# Patient Record
Sex: Female | Born: 1950 | Race: Black or African American | Hispanic: No | Marital: Married | Smoking: Never smoker
Health system: Southern US, Community
[De-identification: ages and names within clinical notes are randomized; demographics above are authoritative.]

## PROBLEM LIST (undated history)

## (undated) DIAGNOSIS — H409 Unspecified glaucoma: Secondary | ICD-10-CM

## (undated) DIAGNOSIS — E119 Type 2 diabetes mellitus without complications: Secondary | ICD-10-CM

## (undated) DIAGNOSIS — D649 Anemia, unspecified: Secondary | ICD-10-CM

## (undated) DIAGNOSIS — N189 Chronic kidney disease, unspecified: Secondary | ICD-10-CM

## (undated) DIAGNOSIS — I10 Essential (primary) hypertension: Secondary | ICD-10-CM

## (undated) HISTORY — PX: BILATERAL SALPINGECTOMY: SHX5743

## (undated) HISTORY — PX: HERNIA REPAIR: SHX51

---

## 1998-11-11 ENCOUNTER — Other Ambulatory Visit: Admission: RE | Admit: 1998-11-11 | Discharge: 1998-11-11 | Payer: Self-pay | Admitting: Internal Medicine

## 2000-11-30 ENCOUNTER — Emergency Department (HOSPITAL_COMMUNITY): Admission: EM | Admit: 2000-11-30 | Discharge: 2000-11-30 | Payer: Self-pay | Admitting: Emergency Medicine

## 2000-11-30 ENCOUNTER — Encounter: Payer: Self-pay | Admitting: Emergency Medicine

## 2001-04-14 ENCOUNTER — Encounter: Admission: RE | Admit: 2001-04-14 | Discharge: 2001-04-14 | Payer: Self-pay | Admitting: Internal Medicine

## 2001-04-14 ENCOUNTER — Encounter: Payer: Self-pay | Admitting: Internal Medicine

## 2004-06-19 ENCOUNTER — Encounter: Admission: RE | Admit: 2004-06-19 | Discharge: 2004-06-19 | Payer: Self-pay | Admitting: Family Medicine

## 2004-08-18 ENCOUNTER — Other Ambulatory Visit: Admission: RE | Admit: 2004-08-18 | Discharge: 2004-08-18 | Payer: Self-pay | Admitting: Internal Medicine

## 2008-01-01 ENCOUNTER — Other Ambulatory Visit: Admission: RE | Admit: 2008-01-01 | Discharge: 2008-01-01 | Payer: Self-pay | Admitting: Family Medicine

## 2008-07-08 ENCOUNTER — Emergency Department (HOSPITAL_COMMUNITY): Admission: EM | Admit: 2008-07-08 | Discharge: 2008-07-08 | Payer: Self-pay | Admitting: Emergency Medicine

## 2009-06-12 ENCOUNTER — Emergency Department (HOSPITAL_COMMUNITY): Admission: EM | Admit: 2009-06-12 | Discharge: 2009-06-12 | Payer: Self-pay | Admitting: Emergency Medicine

## 2010-07-19 LAB — URINALYSIS, ROUTINE W REFLEX MICROSCOPIC
Bilirubin Urine: NEGATIVE
Glucose, UA: NEGATIVE mg/dL
Hgb urine dipstick: NEGATIVE
Ketones, ur: 15 mg/dL — AB
Specific Gravity, Urine: 1.024 (ref 1.005–1.030)
pH: 5.5 (ref 5.0–8.0)

## 2010-07-19 LAB — DIFFERENTIAL
Eosinophils Absolute: 0.1 10*3/uL (ref 0.0–0.7)
Eosinophils Relative: 1 % (ref 0–5)
Lymphocytes Relative: 24 % (ref 12–46)
Lymphs Abs: 1.7 10*3/uL (ref 0.7–4.0)
Monocytes Absolute: 0.4 10*3/uL (ref 0.1–1.0)
Monocytes Relative: 5 % (ref 3–12)

## 2010-07-19 LAB — POCT CARDIAC MARKERS
CKMB, poc: <1 ng/mL — ABNORMAL LOW (ref 1.0–8.0)
Myoglobin, poc: 132 ng/mL (ref 12–200)

## 2010-07-19 LAB — CBC
MCHC: 33.3 g/dL (ref 30.0–36.0)
Platelets: 245 10*3/uL (ref 150–400)
RBC: 4.24 MIL/uL (ref 3.87–5.11)

## 2010-07-19 LAB — COMPREHENSIVE METABOLIC PANEL
ALT: 28 U/L (ref 0–35)
AST: 30 U/L (ref 0–37)
Albumin: 3.9 g/dL (ref 3.5–5.2)
CO2: 26 mEq/L (ref 19–32)
Calcium: 10.1 mg/dL (ref 8.4–10.5)
GFR calc Af Amer: 43 mL/min — ABNORMAL LOW (ref >60)
GFR calc non Af Amer: 35 mL/min — ABNORMAL LOW (ref >60)
Sodium: 140 mEq/L (ref 135–145)
Total Protein: 7.2 g/dL (ref 6.0–8.3)

## 2010-07-19 LAB — URINE MICROSCOPIC-ADD ON

## 2010-08-10 LAB — POCT CARDIAC MARKERS
CKMB, poc: 1.7 ng/mL (ref 1.0–8.0)
CKMB, poc: <1 ng/mL — ABNORMAL LOW (ref 1.0–8.0)
Troponin i, poc: <0.05 ng/mL (ref 0.00–0.09)
Troponin i, poc: <0.05 ng/mL (ref 0.00–0.09)

## 2010-08-10 LAB — CBC
HCT: 38.8 % (ref 36.0–46.0)
Hemoglobin: 13 g/dL (ref 12.0–15.0)
Platelets: 290 10*3/uL (ref 150–400)
RDW: 14.3 % (ref 11.5–15.5)
WBC: 7 10*3/uL (ref 4.0–10.5)

## 2010-08-10 LAB — BASIC METABOLIC PANEL
BUN: 15 mg/dL (ref 6–23)
Calcium: 9.9 mg/dL (ref 8.4–10.5)
GFR calc non Af Amer: 48 mL/min — ABNORMAL LOW (ref >60)
Glucose, Bld: 87 mg/dL (ref 70–99)
Potassium: 3.8 mEq/L (ref 3.5–5.1)
Sodium: 142 mEq/L (ref 135–145)

## 2010-08-10 LAB — DIFFERENTIAL
Basophils Absolute: 0 10*3/uL (ref 0.0–0.1)
Eosinophils Relative: 2 % (ref 0–5)
Lymphocytes Relative: 33 % (ref 12–46)
Lymphs Abs: 2.3 10*3/uL (ref 0.7–4.0)
Neutro Abs: 4.2 10*3/uL (ref 1.7–7.7)

## 2013-12-02 ENCOUNTER — Inpatient Hospital Stay (HOSPITAL_COMMUNITY)
Admission: EM | Admit: 2013-12-02 | Discharge: 2013-12-05 | DRG: 378 | Disposition: A | Discharge status: Home Health | Payer: Medicare HMO | Attending: Internal Medicine | Admitting: Internal Medicine

## 2013-12-02 ENCOUNTER — Emergency Department (HOSPITAL_COMMUNITY): Payer: Medicare HMO

## 2013-12-02 ENCOUNTER — Encounter (HOSPITAL_COMMUNITY): Payer: Self-pay | Admitting: Emergency Medicine

## 2013-12-02 DIAGNOSIS — N179 Acute kidney failure, unspecified: Secondary | ICD-10-CM

## 2013-12-02 DIAGNOSIS — K921 Melena: Principal | ICD-10-CM | POA: Diagnosis present

## 2013-12-02 DIAGNOSIS — H409 Unspecified glaucoma: Secondary | ICD-10-CM

## 2013-12-02 DIAGNOSIS — J338 Other polyp of sinus: Secondary | ICD-10-CM | POA: Diagnosis present

## 2013-12-02 DIAGNOSIS — N183 Chronic kidney disease, stage 3 unspecified: Secondary | ICD-10-CM | POA: Diagnosis present

## 2013-12-02 DIAGNOSIS — K449 Diaphragmatic hernia without obstruction or gangrene: Secondary | ICD-10-CM | POA: Diagnosis present

## 2013-12-02 DIAGNOSIS — D500 Iron deficiency anemia secondary to blood loss (chronic): Secondary | ICD-10-CM | POA: Diagnosis present

## 2013-12-02 DIAGNOSIS — K648 Other hemorrhoids: Secondary | ICD-10-CM | POA: Diagnosis present

## 2013-12-02 DIAGNOSIS — K5730 Diverticulosis of large intestine without perforation or abscess without bleeding: Secondary | ICD-10-CM | POA: Diagnosis present

## 2013-12-02 DIAGNOSIS — N189 Chronic kidney disease, unspecified: Secondary | ICD-10-CM

## 2013-12-02 DIAGNOSIS — D62 Acute posthemorrhagic anemia: Secondary | ICD-10-CM

## 2013-12-02 DIAGNOSIS — E669 Obesity, unspecified: Secondary | ICD-10-CM | POA: Diagnosis present

## 2013-12-02 DIAGNOSIS — I10 Essential (primary) hypertension: Secondary | ICD-10-CM

## 2013-12-02 DIAGNOSIS — I129 Hypertensive chronic kidney disease with stage 1 through stage 4 chronic kidney disease, or unspecified chronic kidney disease: Secondary | ICD-10-CM | POA: Diagnosis present

## 2013-12-02 DIAGNOSIS — K922 Gastrointestinal hemorrhage, unspecified: Secondary | ICD-10-CM

## 2013-12-02 HISTORY — DX: Unspecified glaucoma: H40.9

## 2013-12-02 HISTORY — DX: Essential (primary) hypertension: I10

## 2013-12-02 HISTORY — DX: Anemia, unspecified: D64.9

## 2013-12-02 LAB — CBC WITH DIFFERENTIAL/PLATELET
BASOS ABS: 0 10*3/uL (ref 0.0–0.1)
Basophils Relative: 0 % (ref 0–1)
Eosinophils Absolute: 0.2 10*3/uL (ref 0.0–0.7)
Eosinophils Relative: 3 % (ref 0–5)
HCT: 26.5 % — ABNORMAL LOW (ref 36.0–46.0)
Hemoglobin: 7.8 g/dL — ABNORMAL LOW (ref 12.0–15.0)
LYMPHS ABS: 1.5 10*3/uL (ref 0.7–4.0)
Lymphocytes Relative: 25 % (ref 12–46)
MCH: 19.8 pg — AB (ref 26.0–34.0)
MCHC: 29.4 g/dL — AB (ref 30.0–36.0)
MCV: 67.3 fL — AB (ref 78.0–100.0)
MONO ABS: 0.4 10*3/uL (ref 0.1–1.0)
Monocytes Relative: 7 % (ref 3–12)
Neutro Abs: 3.8 10*3/uL (ref 1.7–7.7)
Neutrophils Relative %: 65 % (ref 43–77)
PLATELETS: 324 10*3/uL (ref 150–400)
RBC: 3.94 MIL/uL (ref 3.87–5.11)
RDW: 19.4 % — AB (ref 11.5–15.5)
WBC: 5.9 10*3/uL (ref 4.0–10.5)

## 2013-12-02 LAB — CBC
HCT: 31 % — ABNORMAL LOW (ref 36.0–46.0)
HEMOGLOBIN: 9.5 g/dL — AB (ref 12.0–15.0)
MCH: 21.2 pg — ABNORMAL LOW (ref 26.0–34.0)
MCHC: 30.6 g/dL (ref 30.0–36.0)
MCV: 69.2 fL — ABNORMAL LOW (ref 78.0–100.0)
Platelets: 330 10*3/uL (ref 150–400)
RBC: 4.48 MIL/uL (ref 3.87–5.11)
RDW: 19.2 % — AB (ref 11.5–15.5)
WBC: 9.3 10*3/uL (ref 4.0–10.5)

## 2013-12-02 LAB — COMPREHENSIVE METABOLIC PANEL
ALBUMIN: 4 g/dL (ref 3.5–5.2)
ALT: 12 U/L (ref 0–35)
ANION GAP: 15 (ref 5–15)
AST: 19 U/L (ref 0–37)
Alkaline Phosphatase: 105 U/L (ref 39–117)
BUN: 33 mg/dL — AB (ref 6–23)
CALCIUM: 9.9 mg/dL (ref 8.4–10.5)
CO2: 22 mEq/L (ref 19–32)
CREATININE: 1.97 mg/dL — AB (ref 0.50–1.10)
Chloride: 104 mEq/L (ref 96–112)
GFR calc Af Amer: 30 mL/min — ABNORMAL LOW (ref >90)
GFR, EST NON AFRICAN AMERICAN: 25 mL/min — AB (ref >90)
Glucose, Bld: 120 mg/dL — ABNORMAL HIGH (ref 70–99)
Potassium: 4.2 mEq/L (ref 3.7–5.3)
Sodium: 141 mEq/L (ref 137–147)
Total Bilirubin: 0.2 mg/dL — ABNORMAL LOW (ref 0.3–1.2)
Total Protein: 7.5 g/dL (ref 6.0–8.3)

## 2013-12-02 LAB — PREPARE RBC (CROSSMATCH)

## 2013-12-02 LAB — LIPASE, BLOOD: LIPASE: 69 U/L — AB (ref 11–59)

## 2013-12-02 LAB — ABO/RH: ABO/RH(D): O POS

## 2013-12-02 MED ORDER — ALBUTEROL SULFATE (2.5 MG/3ML) 0.083% IN NEBU: 2.5000 mg | INHALATION_SOLUTION | RESPIRATORY_TRACT | Status: DC | PRN

## 2013-12-02 MED ORDER — ONDANSETRON HCL 4 MG PO TABS: 4.0000 mg | ORAL_TABLET | Freq: Four times a day (QID) | ORAL | Status: DC | PRN

## 2013-12-02 MED ORDER — DIPHENHYDRAMINE HCL 50 MG/ML IJ SOLN
25.0000 mg | Freq: Once | INTRAMUSCULAR | Status: DC
  Filled 2013-12-02: qty 1

## 2013-12-02 MED ORDER — PNEUMOCOCCAL VAC POLYVALENT 25 MCG/0.5ML IJ INJ
0.5000 mL | INJECTION | INTRAMUSCULAR | Status: AC
  Administered 2013-12-03: 0.5 mL via INTRAMUSCULAR
  Filled 2013-12-02: qty 0.5

## 2013-12-02 MED ORDER — SODIUM CHLORIDE 0.9 % IV SOLN
INTRAVENOUS | Status: DC
  Administered 2013-12-02: 22:00:00 via INTRAVENOUS

## 2013-12-02 MED ORDER — SODIUM CHLORIDE 0.9 % IV SOLN
Freq: Once | INTRAVENOUS | Status: AC
  Administered 2013-12-02: 18:00:00 via INTRAVENOUS

## 2013-12-02 MED ORDER — GUAIFENESIN-DM 100-10 MG/5ML PO SYRP
5.0000 mL | ORAL_SOLUTION | ORAL | Status: DC | PRN
  Filled 2013-12-02: qty 5

## 2013-12-02 MED ORDER — TRAVOPROST 0.004 % OP SOLN
1.0000 [drp] | Freq: Every day | OPHTHALMIC | Status: DC
  Administered 2013-12-02 – 2013-12-04 (×3): 1 [drp] via OPHTHALMIC
  Filled 2013-12-02: qty 5

## 2013-12-02 MED ORDER — ACETAMINOPHEN 325 MG PO TABS: 650.0000 mg | ORAL_TABLET | Freq: Four times a day (QID) | ORAL | Status: DC | PRN

## 2013-12-02 MED ORDER — BRIMONIDINE TARTRATE 0.2 % OP SOLN
1.0000 [drp] | Freq: Two times a day (BID) | OPHTHALMIC | Status: DC
  Administered 2013-12-02 – 2013-12-04 (×5): 1 [drp] via OPHTHALMIC
  Filled 2013-12-02: qty 5

## 2013-12-02 MED ORDER — PANTOPRAZOLE SODIUM 40 MG IV SOLR
40.0000 mg | Freq: Two times a day (BID) | INTRAVENOUS | Status: DC
  Administered 2013-12-02 – 2013-12-05 (×6): 40 mg via INTRAVENOUS
  Filled 2013-12-02 (×7): qty 40

## 2013-12-02 MED ORDER — ONDANSETRON HCL 4 MG/2ML IJ SOLN: 4.0000 mg | Freq: Once | INTRAMUSCULAR | Status: DC

## 2013-12-02 MED ORDER — ACETAMINOPHEN 325 MG PO TABS
650.0000 mg | ORAL_TABLET | Freq: Once | ORAL | Status: AC
  Administered 2013-12-02: 650 mg via ORAL
  Filled 2013-12-02: qty 2

## 2013-12-02 MED ORDER — BRIMONIDINE TARTRATE-TIMOLOL 0.2-0.5 % OP SOLN: 1.0000 [drp] | Freq: Two times a day (BID) | OPHTHALMIC | Status: DC

## 2013-12-02 MED ORDER — SODIUM CHLORIDE 0.9 % IV SOLN
INTRAVENOUS | Status: DC
  Administered 2013-12-02: 15:00:00 via INTRAVENOUS

## 2013-12-02 MED ORDER — SODIUM CHLORIDE 0.9 % IV SOLN
INTRAVENOUS | Status: DC
Start: 2013-12-03 — End: 2013-12-02
  Administered 2013-12-02: 09:00:00 via INTRAVENOUS

## 2013-12-02 MED ORDER — FUROSEMIDE 10 MG/ML IJ SOLN
20.0000 mg | Freq: Once | INTRAMUSCULAR | Status: AC
  Administered 2013-12-02: 20 mg via INTRAVENOUS
  Filled 2013-12-02: qty 2

## 2013-12-02 MED ORDER — TIMOLOL MALEATE 0.5 % OP SOLN
1.0000 [drp] | Freq: Two times a day (BID) | OPHTHALMIC | Status: DC
  Administered 2013-12-02 – 2013-12-04 (×5): 1 [drp] via OPHTHALMIC
  Filled 2013-12-02: qty 5

## 2013-12-02 MED ORDER — ACETAMINOPHEN 650 MG RE SUPP: 650.0000 mg | Freq: Four times a day (QID) | RECTAL | Status: DC | PRN

## 2013-12-02 MED ORDER — SODIUM CHLORIDE 0.9 % IV BOLUS (SEPSIS)
250.0000 mL | Freq: Once | INTRAVENOUS | Status: AC
  Administered 2013-12-02: 250 mL via INTRAVENOUS

## 2013-12-02 MED ORDER — SODIUM CHLORIDE 0.9 % IV SOLN: 10.0000 mL/h | Freq: Once | INTRAVENOUS | Status: DC

## 2013-12-02 MED ORDER — IOHEXOL 300 MG/ML  SOLN
100.0000 mL | Freq: Once | INTRAMUSCULAR | Status: AC | PRN
  Administered 2013-12-02: 80 mL via INTRAVENOUS

## 2013-12-02 MED ORDER — ONDANSETRON HCL 4 MG/2ML IJ SOLN: 4.0000 mg | Freq: Four times a day (QID) | INTRAMUSCULAR | Status: DC | PRN

## 2013-12-02 MED ORDER — IOHEXOL 300 MG/ML  SOLN
25.0000 mL | INTRAMUSCULAR | Status: DC
  Administered 2013-12-02: 25 mL via ORAL

## 2013-12-02 NOTE — ED Notes (Signed)
Pt states she is here for bright red rectal bleeding. Noticed the commode had a bunch of blood in it this morning after having a BM this am. States she has issues with her Hgb as well and had it checked at PCP on 11/30/13 which was 13. Pt denies any pain associated with the complaint. No N/V.

## 2013-12-02 NOTE — Progress Notes (Signed)
Report called to ED  

## 2013-12-02 NOTE — Progress Notes (Signed)
[facility] [id number] Code Status: Full   Admission Data: 12/02/2013 4:36 PM Attending Provider:  Sloan Leiter  JJO:ACZYSA,YTKZS W, MD Consults/ Treatment Team: Treatment Team:  Missy Sabins, MD  DELORICE BANNISTER is a 63 y.o. female patient admitted from ED awake, alert - oriented  X 3 - no acute distress noted.  VSS - Blood pressure 142/81, pulse 59, temperature 98.3 F (36.8 C), temperature source Oral, resp. rate 15, height 5\' 4"  (1.626 m), weight 91.627 kg (202 lb), SpO2 99.00%.    IV in place, occlusive dsg intact without redness.  Orientation to room, and floor completed with information packet given to patient/family. Admission INP armband ID verified with patient/family, and in place.   SR up x 2, fall assessment complete, with patient and family able to verbalize understanding of risk associated with falls, and verbalized understanding to call nsg before up out of bed.  Call light within reach, patient able to voice, and demonstrate understanding.  Skin, clean-dry- intact without evidence of bruising, or skin tears.   No evidence of skin break down noted on exam.     Will cont to eval and treat per MD orders.  Henriette Combs, [place] 12/02/2013 4:36 PM '

## 2013-12-02 NOTE — ED Notes (Signed)
Consent signed for blood transfusion

## 2013-12-02 NOTE — ED Provider Notes (Signed)
CSN: [id number]     Arrival date & time 12/02/13  0728 History   First MD Initiated Contact with Patient 12/02/13 [phone]     Chief Complaint  Patient presents with  . Rectal Bleeding     (Consider location/radiation/quality/duration/timing/severity/associated sxs/prior Treatment) Patient is a 63 y.o. female presenting with hematochezia. The history is provided by the patient.  Rectal Bleeding Associated symptoms: no abdominal pain and no vomiting    patient with onset of a small amount of rectal bleeding yesterday. This morning had a large bowel movement of red blood. Patient states that she feels a little lightheaded perhaps some mild near syncope feelings. No bowel pain no nausea no vomiting. Patient claims that she had her hemoglobin checked by her primary care Dr. on August 3 and it was 13.  Past Medical History  Diagnosis Date  . Hypertension   . Low hemoglobin    Past Surgical History  Procedure Laterality Date  . Hernia repair    . Bilateral salpingectomy     No family history on file. History  Substance Use Topics  . Smoking status: Never Smoker   . Smokeless tobacco: Not on file  . Alcohol Use: No   OB History   Grav Para Term Preterm Abortions TAB SAB Ect Mult Living                 Review of Systems  Constitutional: Negative for fatigue.  HENT: Negative for congestion.   Eyes: Negative for redness.  Respiratory: Negative for shortness of breath.   Cardiovascular: Negative for chest pain.  Gastrointestinal: Positive for blood in stool and hematochezia. Negative for nausea, vomiting and abdominal pain.  Genitourinary: Negative for dysuria and hematuria.  Musculoskeletal: Negative for back pain.  Skin: Negative for rash.  Hematological: Does not bruise/bleed easily.  Psychiatric/Behavioral: Negative for confusion.      Allergies  Review of patient's allergies indicates no known allergies.  Home Medications   Prior to Admission medications   Medication  Sig Start Date End Date Taking? Authorizing Provider  amLODipine (NORVASC) 10 MG tablet Take 10 mg by mouth daily.   Yes Historical Provider, MD  brimonidine-timolol (COMBIGAN) 0.2-0.5 % ophthalmic solution Place 1 drop into both eyes every 12 (twelve) hours.   Yes Historical Provider, MD  travoprost, benzalkonium, (TRAVATAN) 0.004 % ophthalmic solution Place 1 drop into both eyes at bedtime.   Yes Historical Provider, MD  triamterene-hydrochlorothiazide (MAXZIDE-25) 37.5-25 MG per tablet Take 1 tablet by mouth daily.   Yes Historical Provider, MD   BP 142/78  Pulse 76  Temp(Src) 98.3 F (36.8 C) (Oral)  Resp 16  Ht 5\' 4"  (1.626 m)  Wt 202 lb (91.627 kg)  BMI 34.66 kg/m2  SpO2 100% Physical Exam  Constitutional: She is oriented to person, place, and time. She appears well-developed and well-nourished.  HENT:  Head: Normocephalic and atraumatic.  Mouth/Throat: Oropharynx is clear and moist.  Eyes: Conjunctivae and EOM are normal. Pupils are equal, round, and reactive to light.  Neck: Normal range of motion.  Cardiovascular: Normal rate, regular rhythm and normal heart sounds.   No murmur heard. Pulmonary/Chest: Effort normal and breath sounds normal. No respiratory distress.  Abdominal: Soft. Bowel sounds are normal. There is no tenderness.  Genitourinary: Guaiac positive stool.  Red blood on rectal exam.  Musculoskeletal: Normal range of motion.  Neurological: She is alert and oriented to person, place, and time. No cranial nerve deficit. She exhibits normal muscle tone. Coordination normal.  Skin:  Skin is warm. No rash noted.    ED Course  Procedures (including critical care time) Labs Review Labs Reviewed  CBC WITH DIFFERENTIAL - Abnormal; Notable for the following:    Hemoglobin 7.8 (*)    HCT 26.5 (*)    MCV 67.3 (*)    MCH 19.8 (*)    MCHC 29.4 (*)    RDW 19.4 (*)    All other components within normal limits  COMPREHENSIVE METABOLIC PANEL - Abnormal; Notable for the  following:    Glucose, Bld 120 (*)    BUN 33 (*)    Creatinine, Ser 1.97 (*)    Total Bilirubin 0.2 (*)    GFR calc non Af Amer 25 (*)    GFR calc Af Amer 30 (*)    All other components within normal limits  LIPASE, BLOOD - Abnormal; Notable for the following:    Lipase 69 (*)    All other components within normal limits  TYPE AND SCREEN  PREPARE RBC (CROSSMATCH)  ABO/RH   Results for orders placed during the hospital encounter of 12/02/13  CBC WITH DIFFERENTIAL      Result Value Ref Range   WBC 5.9  4.0 - 10.5 K/uL   RBC 3.94  3.87 - 5.11 MIL/uL   Hemoglobin 7.8 (*) 12.0 - 15.0 g/dL   HCT 26.5 (*) 36.0 - 46.0 %   MCV 67.3 (*) 78.0 - 100.0 fL   MCH 19.8 (*) 26.0 - 34.0 pg   MCHC 29.4 (*) 30.0 - 36.0 g/dL   RDW 19.4 (*) 11.5 - 15.5 %   Platelets 324  150 - 400 K/uL   Neutrophils Relative % 65  43 - 77 %   Lymphocytes Relative 25  12 - 46 %   Monocytes Relative 7  3 - 12 %   Eosinophils Relative 3  0 - 5 %   Basophils Relative 0  0 - 1 %   Neutro Abs 3.8  1.7 - 7.7 K/uL   Lymphs Abs 1.5  0.7 - 4.0 K/uL   Monocytes Absolute 0.4  0.1 - 1.0 K/uL   Eosinophils Absolute 0.2  0.0 - 0.7 K/uL   Basophils Absolute 0.0  0.0 - 0.1 K/uL   RBC Morphology ELLIPTOCYTES    COMPREHENSIVE METABOLIC PANEL      Result Value Ref Range   Sodium 141  137 - 147 mEq/L   Potassium 4.2  3.7 - 5.3 mEq/L   Chloride 104  96 - 112 mEq/L   CO2 22  19 - 32 mEq/L   Glucose, Bld 120 (*) 70 - 99 mg/dL   BUN 33 (*) 6 - 23 mg/dL   Creatinine, Ser 1.97 (*) 0.50 - 1.10 mg/dL   Calcium 9.9  8.4 - 10.5 mg/dL   Total Protein 7.5  6.0 - 8.3 g/dL   Albumin 4.0  3.5 - 5.2 g/dL   AST 19  0 - 37 U/L   ALT 12  0 - 35 U/L   Alkaline Phosphatase 105  39 - 117 U/L   Total Bilirubin 0.2 (*) 0.3 - 1.2 mg/dL   GFR calc non Af Amer 25 (*) >90 mL/min   GFR calc Af Amer 30 (*) >90 mL/min   Anion gap 15  5 - 15  LIPASE, BLOOD      Result Value Ref Range   Lipase 69 (*) 11 - 59 U/L  TYPE AND SCREEN      Result Value  Ref Range  ABO/RH(D) O POS     Antibody Screen NEG     Sample Expiration 12/05/2013     Unit Number [id number]     Blood Component Type RED CELLS,LR     Unit division 00     Status of Unit ISSUED     Transfusion Status OK TO TRANSFUSE     Crossmatch Result Compatible    PREPARE RBC (CROSSMATCH)      Result Value Ref Range   Order Confirmation ORDER PROCESSED BY BLOOD BANK    ABO/RH      Result Value Ref Range   ABO/RH(D) O POS       Imaging Review Ct Abdomen Pelvis W Contrast  12/02/2013   CLINICAL DATA:  Rectal bleeding  EXAM: CT ABDOMEN AND PELVIS WITH CONTRAST  TECHNIQUE: Multidetector CT imaging of the abdomen and pelvis was performed using the standard protocol following bolus administration of intravenous contrast.  CONTRAST:  63mL OMNIPAQUE IOHEXOL 300 MG/ML  SOLN  COMPARISON:  06/19/2004  FINDINGS: The liver, gallbladder, spleen, pancreas, adrenal glands, kidneys are within normal limits  No free-fluid.  No abnormal retroperitoneal adenopathy.  Bladder is markedly distended.  No obvious rectal mass.  Mild L3-4 and L4-5 facet arthropathy.  IMPRESSION: Bladder distention.  No acute pathology.   Electronically Signed   By: Maryclare Bean M.D.   On: 12/02/2013 10:45   Dg Chest Port 1 View  12/02/2013   CLINICAL DATA:  Rectal bleeding, hypertension, anemia  EXAM: PORTABLE CHEST - 1 VIEW  COMPARISON:  06/12/2009  FINDINGS: The heart size and mediastinal contours are within normal limits. Both lungs are clear. The visualized skeletal structures are unremarkable.  IMPRESSION: No active disease.   Electronically Signed   By: Daryll Brod M.D.   On: 12/02/2013 09:10     EKG Interpretation   Date/Time:  Wednesday December 02 2013 07:50:09 EDT Ventricular Rate:  85 PR Interval:  196 QRS Duration: 127 QT Interval:  391 QTC Calculation: 465 R Axis:   -36 Text Interpretation:  Sinus rhythm Right bundle branch block Inferior  infarct, old No significant change since last tracing Confirmed  by  Rickie Gange  MD, Lorene Klimas [phone]) on 12/02/2013 9:12:45 AM      CRITICAL CARE Performed by: Fredia Sorrow Total critical care time: 30 Critical care time was exclusive of separately billable procedures and treating other patients. Critical care was necessary to treat or prevent imminent or life-threatening deterioration. Critical care was time spent personally by me on the following activities: development of treatment plan with patient and/or surrogate as well as nursing, discussions with consultants, evaluation of patient's response to treatment, examination of patient, obtaining history from patient or surrogate, ordering and performing treatments and interventions, ordering and review of laboratory studies, ordering and review of radiographic studies, pulse oximetry and re-evaluation of patient's condition.      MDM   Final diagnoses:  Lower GI bleed    Patient's blood pressure without tachycardia or hypotension. However hemoglobin was significant drop compared to August 3 as per patient. Now down to 7.9. Patient typed and screened and transfused one unit of blood. Patient on rectal exam had red blood. Patient will require admission. Discussed with hospitalist team they will admit. Patient has remained stable vital sign-wise while in the emergency department. CT scan gave no explanation for the rectal bleeding.    Fredia Sorrow, MD 12/02/13 [phone]

## 2013-12-02 NOTE — H&P (Signed)
PATIENT DETAILS Name: Jamie Payne Age: 63 y.o. Sex: female Date of Birth: [birth year] Admit Date: 12/02/2013 DJS:HFWYOV,ZCHYI W, MD   CHIEF COMPLAINT:  Hematochezia  HPI: Jamie Payne is a 63 y.o. female with a Past Medical History of obesity, HTN, and glaucoma with recent intentional 30 pound weight loss who presents today with an episode of significant hematochezia this morning. States had one small episode yesterday; today's is much larger. Baseline Hgb is 13, but recently dropped to 8 within past few months, and today is at 7. Has been seen by PCP on a regular basis, receiving consistent CBC workups and referral to GI. Evaluation by GI has been scheduled for 8/13. Denies NVD, melena, abdominal pain, rectal pain, lightheadedness, SOB, chest pain. Denies family hx of cancer.   ALLERGIES:  No Known Allergies  PAST MEDICAL HISTORY: Past Medical History  Diagnosis Date  . Hypertension   . Low hemoglobin     PAST SURGICAL HISTORY: Past Surgical History  Procedure Laterality Date  . Hernia repair    . Bilateral salpingectomy      MEDICATIONS AT HOME: Prior to Admission medications   Medication Sig Start Date End Date Taking? Authorizing Provider  amLODipine (NORVASC) 10 MG tablet Take 10 mg by mouth daily.   Yes Historical Provider, MD  brimonidine-timolol (COMBIGAN) 0.2-0.5 % ophthalmic solution Place 1 drop into both eyes every 12 (twelve) hours.   Yes Historical Provider, MD  travoprost, benzalkonium, (TRAVATAN) 0.004 % ophthalmic solution Place 1 drop into both eyes at bedtime.   Yes Historical Provider, MD  triamterene-hydrochlorothiazide (MAXZIDE-25) 37.5-25 MG per tablet Take 1 tablet by mouth daily.   Yes Historical Provider, MD    FAMILY HISTORY: Father passed from Renal failure. Brother passed from MI secondary to DM Type 2. No family hx of cancer.   SOCIAL HISTORY:  reports that she has never smoked. She does not have any smokeless tobacco history on  file. She reports that she does not drink alcohol or use illicit drugs.  REVIEW OF SYSTEMS:  Constitutional:   No night sweats, Fevers, chills, fatigue.  HEENT:    No headaches, Difficulty swallowing,Tooth/dental problems,Sore throat,  No sneezing, itching, ear ache, nasal congestion, post nasal drip,   Cardio-vascular: No chest pain,  Orthopnea, PND, swelling in lower extremities, anasarca, dizziness, palpitations  GI:  No heartburn, indigestion, abdominal pain, nausea, vomiting, diarrhea, change in bowel habits, loss of appetite  Resp: No shortness of breath with exertion or at rest.  No excess mucus, no productive cough, No non-productive cough,  No coughing up of blood.No change in color of mucus.No wheezing.No chest wall deformity  Skin:  no rash or lesions.  GU:  no dysuria, change in color of urine, no urgency or frequency.  No flank pain.  Musculoskeletal: No joint pain or swelling.  No decreased range of motion.  No back pain.  Psych: No change in mood or affect. No depression or anxiety.  No memory loss.   PHYSICAL EXAM: Blood pressure 142/78, pulse 76, temperature 98.3 F (36.8 C), temperature source Oral, resp. rate 16, height 5\' 4"  (1.626 m), weight 91.627 kg (202 lb), SpO2 100.00%.  General appearance :Awake, alert, not in any distress. Speech Clear. Not toxic Looking HEENT: Atraumatic and Normocephalic, pupils equally reactive to light and accomodation Neck: supple, no JVD. No cervical lymphadenopathy.  Chest:Good air entry bilaterally, no added sounds  CVS: S1 S2 regular,  Soft systolic murmur noted.  Abdomen:  Bowel sounds present, Non tender and not distended with no gaurding, rigidity or rebound. Extremities: B/L Lower Ext shows no edema, both legs are warm to touch Neurology: Awake alert, and oriented X 3, CN II-XII intact, Non focal Skin:No Rash Wounds:N/A  LABS ON ADMISSION:   Recent Labs  12/02/13 0801  NA 141  K 4.2  CL 104  CO2 22    GLUCOSE 120*  BUN 33*  CREATININE 1.97*  CALCIUM 9.9    Recent Labs  12/02/13 0801  AST 19  ALT 12  ALKPHOS 105  BILITOT 0.2*  PROT 7.5  ALBUMIN 4.0    Recent Labs  12/02/13 0801  LIPASE 69*    Recent Labs  12/02/13 0801  WBC 5.9  NEUTROABS 3.8  HGB 7.8*  HCT 26.5*  MCV 67.3*  PLT 324   No results found for this basename: CKTOTAL, CKMB, CKMBINDEX, TROPONINI,  in the last 72 hours No results found for this basename: DDIMER,  in the last 72 hours No components found with this basename: POCBNP,    RADIOLOGIC STUDIES ON ADMISSION: Ct Abdomen Pelvis W Contrast  12/02/2013   CLINICAL DATA:  Rectal bleeding  EXAM: CT ABDOMEN AND PELVIS WITH CONTRAST  TECHNIQUE: Multidetector CT imaging of the abdomen and pelvis was performed using the standard protocol following bolus administration of intravenous contrast.  CONTRAST:  78mL OMNIPAQUE IOHEXOL 300 MG/ML  SOLN  COMPARISON:  06/19/2004  FINDINGS: The liver, gallbladder, spleen, pancreas, adrenal glands, kidneys are within normal limits  No free-fluid.  No abnormal retroperitoneal adenopathy.  Bladder is markedly distended.  No obvious rectal mass.  Mild L3-4 and L4-5 facet arthropathy.  IMPRESSION: Bladder distention.  No acute pathology.   Electronically Signed   By: Maryclare Bean M.D.   On: 12/02/2013 10:45   Dg Chest Port 1 View  12/02/2013   CLINICAL DATA:  Rectal bleeding, hypertension, anemia  EXAM: PORTABLE CHEST - 1 VIEW  COMPARISON:  06/12/2009  FINDINGS: The heart size and mediastinal contours are within normal limits. Both lungs are clear. The visualized skeletal structures are unremarkable.  IMPRESSION: No active disease.   Electronically Signed   By: Daryll Brod M.D.   On: 12/02/2013 09:10     EKG: Independently reviewed. Sinus rhythm; RBB block.   ASSESSMENT AND PLAN: Present on Admission:  . GI bleed . Acute blood loss anemia . ARF (acute renal failure) . HTN (hypertension) . Glaucoma  Primary  Problem: Hematochezia: - most likely lower acute GI bleed, however concern for chronic GI loss given microcytic anemia. Has not had a Colonoscopy yet.  - Hgb 7.8 in ED - given 1 unit of RBC in ED, will transfuse 1 more unit of PRBC upon admission - plan for GI consult with anticipated intention to perform endoscopy and/or colonoscopy - admitting to inpatient, supportive care, PPI BID for now and frequent CBC checks. Transfuse prn.   Active Problems: Suspected Acute on Chronic Blood Loss Anemia: - microcytic; suggestive of chronic blood loss anemia, with acute anemia secondary to hematochezia - Hgb was 13 prior to July 2015; Hgb dropped to 8 in July; currently 7.8 - 30lb weight loss within past 6 months; intentional, yet still concerning - consider iron supplementation  Acute Renal Failure: - creatinine up to 1.97 - anticipate pre-renal, hypovolemic cause - patient states has not eaten all day  Hypertension: - controlled at home with amlodipine and Maxzide  - hold home regimen till GI bleed completely subsides  Glaucoma: - stable -  Continue Combigan and Travatan  Further plan will depend as patient's clinical course evolves and further radiologic and laboratory data become available. Patient will be monitored closely.  Above noted plan was discussed with patient/family , they were in agreement.   DVT Prophylaxis: SCD's  Code Status: Full Code  Total time spent for admission equals 45 minutes.  Margo Common  [facility] Pager [phone]  If 7PM-7AM, please contact night-coverage [email or web address] Password [email or web address] 12/02/2013, 3:11 PM  **Disclaimer: This note may have been dictated with voice recognition software. Similar sounding words can inadvertently be transcribed and this note may contain transcription errors which may not have been corrected upon publication of note.**  Attending Patient was seen, examined,treatment plan was discussed  with the Physician extender. I have directly reviewed the clinical findings, lab, imaging studies and management of this patient in detail. I have made the necessary changes to the above noted documentation, and agree with the documentation, as recorded by the Physician extender.  Nena Alexander MD [facility].

## 2013-12-02 NOTE — Progress Notes (Signed)
Second unit of blood completed. Patient reports that she had no fever, chills, n/v, or SOB. Post cbc has been ordered. Will continue to monitor

## 2013-12-02 NOTE — ED Notes (Signed)
Portable xray being completed.  

## 2013-12-02 NOTE — ED Notes (Signed)
  Pt transported to ct 

## 2013-12-02 NOTE — ED Notes (Signed)
Dr. Sloan Leiter at the bedside.

## 2013-12-02 NOTE — ED Notes (Signed)
Dr. Zackowski at the bedside.  

## 2013-12-03 DIAGNOSIS — N189 Chronic kidney disease, unspecified: Secondary | ICD-10-CM

## 2013-12-03 DIAGNOSIS — N179 Acute kidney failure, unspecified: Secondary | ICD-10-CM

## 2013-12-03 DIAGNOSIS — K921 Melena: Secondary | ICD-10-CM | POA: Diagnosis not present

## 2013-12-03 LAB — CBC
HCT: 32.7 % — ABNORMAL LOW (ref 36.0–46.0)
HCT: 33.5 % — ABNORMAL LOW (ref 36.0–46.0)
HEMATOCRIT: 33.6 % — AB (ref 36.0–46.0)
HEMOGLOBIN: 10.2 g/dL — AB (ref 12.0–15.0)
HEMOGLOBIN: 10.5 g/dL — AB (ref 12.0–15.0)
Hemoglobin: 10.4 g/dL — ABNORMAL LOW (ref 12.0–15.0)
MCH: 21.6 pg — ABNORMAL LOW (ref 26.0–34.0)
MCH: 21.6 pg — ABNORMAL LOW (ref 26.0–34.0)
MCH: 21.8 pg — ABNORMAL LOW (ref 26.0–34.0)
MCHC: 31 g/dL (ref 30.0–36.0)
MCHC: 31.2 g/dL (ref 30.0–36.0)
MCHC: 31.3 g/dL (ref 30.0–36.0)
MCV: 69.1 fL — ABNORMAL LOW (ref 78.0–100.0)
MCV: 69.5 fL — ABNORMAL LOW (ref 78.0–100.0)
MCV: 69.9 fL — ABNORMAL LOW (ref 78.0–100.0)
PLATELETS: 321 10*3/uL (ref 150–400)
Platelets: 317 10*3/uL (ref 150–400)
Platelets: 341 10*3/uL (ref 150–400)
RBC: 4.73 MIL/uL (ref 3.87–5.11)
RBC: 4.81 MIL/uL (ref 3.87–5.11)
RBC: 4.82 MIL/uL (ref 3.87–5.11)
RDW: 19.8 % — ABNORMAL HIGH (ref 11.5–15.5)
RDW: 20 % — ABNORMAL HIGH (ref 11.5–15.5)
RDW: 20.1 % — ABNORMAL HIGH (ref 11.5–15.5)
WBC: 6.6 10*3/uL (ref 4.0–10.5)
WBC: 7.5 10*3/uL (ref 4.0–10.5)
WBC: 9.6 10*3/uL (ref 4.0–10.5)

## 2013-12-03 LAB — TYPE AND SCREEN
ABO/RH(D): O POS
Antibody Screen: NEGATIVE
UNIT DIVISION: 0
Unit division: 0

## 2013-12-03 LAB — BASIC METABOLIC PANEL
Anion gap: 15 (ref 5–15)
BUN: 28 mg/dL — ABNORMAL HIGH (ref 6–23)
CHLORIDE: 101 meq/L (ref 96–112)
CO2: 24 mEq/L (ref 19–32)
Calcium: 9.7 mg/dL (ref 8.4–10.5)
Creatinine, Ser: 1.81 mg/dL — ABNORMAL HIGH (ref 0.50–1.10)
GFR, EST AFRICAN AMERICAN: 33 mL/min — AB (ref >90)
GFR, EST NON AFRICAN AMERICAN: 28 mL/min — AB (ref >90)
GLUCOSE: 89 mg/dL (ref 70–99)
POTASSIUM: 4.1 meq/L (ref 3.7–5.3)
Sodium: 140 mEq/L (ref 137–147)

## 2013-12-03 MED ORDER — SODIUM CHLORIDE 0.9 % IV SOLN: INTRAVENOUS | Status: DC

## 2013-12-03 MED ORDER — HYDRALAZINE HCL 20 MG/ML IJ SOLN: 5.0000 mg | Freq: Four times a day (QID) | INTRAMUSCULAR | Status: DC | PRN

## 2013-12-03 MED ORDER — PEG 3350-KCL-NA BICARB-NACL 420 G PO SOLR
4000.0000 mL | Freq: Once | ORAL | Status: AC
  Administered 2013-12-03: 4000 mL via ORAL
  Filled 2013-12-03 (×2): qty 4000

## 2013-12-03 NOTE — Consult Note (Signed)
[facility] Consult Note  Referring Cassy Sprowl: No ref. Giordan Fordham found Primary Care Physician:  Gara Kroner, MD Primary Gastroenterologist:  Dr.  Laurel Dimmer Complaint: Rectal bleeding HPI: Jamie Payne is an 63 y.o.b lack female  who presents with painless bright red blood per rectum beginning 2 days ago and yesterday. She already been noted to have a drop in hemoglobin from a baseline of 13 down to 8 without any bright red blood or dark stools noted. She was referred to [facility] gastroenterology for colonoscopy when her acute bleeding occurred. Her hemoglobin fell to 7 and she was transfused 2 units packed red blood cells with a subsequent hemoglobin of 10.5. Her BUN was 28 and creatinine 1.81. She denies any melena or dark stools. She denies nonsteroidal anti-inflammatory drugs. She has no family history of GI malignancy. She had a colonoscopy about 10 years ago but as far she has did not show any polyps. She had a CT scan of the abdomen and pelvis yesterday which was normal. Her MCV was 69.5  Past Medical History  Diagnosis Date  . Hypertension   . Low hemoglobin   . Glaucoma     Past Surgical History  Procedure Laterality Date  . Hernia repair    . Bilateral salpingectomy      Medications Prior to Admission  Medication Sig Dispense Refill  . amLODipine (NORVASC) 10 MG tablet Take 10 mg by mouth daily.      . brimonidine-timolol (COMBIGAN) 0.2-0.5 % ophthalmic solution Place 1 drop into both eyes every 12 (twelve) hours.      . travoprost, benzalkonium, (TRAVATAN) 0.004 % ophthalmic solution Place 1 drop into both eyes at bedtime.      . triamterene-hydrochlorothiazide (MAXZIDE-25) 37.5-25 MG per tablet Take 1 tablet by mouth daily.        Allergies: No Known Allergies  History reviewed. No pertinent family history.  Social History:  reports that she has never smoked. She does not have any smokeless tobacco history on file. She reports that she does not drink alcohol or  use illicit drugs.  Review of Systems: negative except as above   Blood pressure 129/77, pulse 70, temperature 97.9 F (36.6 C), temperature source Oral, resp. rate 16, height 5' 4"  (1.626 m), weight 93.622 kg (206 lb 6.4 oz), SpO2 98.00%. Head: Normocephalic, without obvious abnormality, atraumatic Neck: no adenopathy, no carotid bruit, no JVD, supple, symmetrical, trachea midline and thyroid not enlarged, symmetric, no tenderness/mass/nodules Resp: clear to auscultation bilaterally Cardio: regular rate and rhythm, S1, S2 normal, no murmur, click, rub or gallop GI: Abdomen soft nondistended with normoactive bowel sounds. No hepatosplenomegaly mass or guarding Extremities: extremities normal, atraumatic, no cyanosis or edema  Results for orders placed during the hospital encounter of 12/02/13 (from the past 48 hour(s))  CBC WITH DIFFERENTIAL     Status: Abnormal   Collection Time    12/02/13  8:01 AM      Result Value Ref Range   WBC 5.9  4.0 - 10.5 K/uL   RBC 3.94  3.87 - 5.11 MIL/uL   Hemoglobin 7.8 (*) 12.0 - 15.0 g/dL   HCT 26.5 (*) 36.0 - 46.0 %   MCV 67.3 (*) 78.0 - 100.0 fL   MCH 19.8 (*) 26.0 - 34.0 pg   MCHC 29.4 (*) 30.0 - 36.0 g/dL   RDW 19.4 (*) 11.5 - 15.5 %   Platelets 324  150 - 400 K/uL   Neutrophils Relative % 65  43 - 77 %  Lymphocytes Relative 25  12 - 46 %   Monocytes Relative 7  3 - 12 %   Eosinophils Relative 3  0 - 5 %   Basophils Relative 0  0 - 1 %   Neutro Abs 3.8  1.7 - 7.7 K/uL   Lymphs Abs 1.5  0.7 - 4.0 K/uL   Monocytes Absolute 0.4  0.1 - 1.0 K/uL   Eosinophils Absolute 0.2  0.0 - 0.7 K/uL   Basophils Absolute 0.0  0.0 - 0.1 K/uL   RBC Morphology ELLIPTOCYTES     Comment: TEARDROP CELLS  COMPREHENSIVE METABOLIC PANEL     Status: Abnormal   Collection Time    12/02/13  8:01 AM      Result Value Ref Range   Sodium 141  137 - 147 mEq/L   Potassium 4.2  3.7 - 5.3 mEq/L   Chloride 104  96 - 112 mEq/L   CO2 22  19 - 32 mEq/L   Glucose, Bld 120  (*) 70 - 99 mg/dL   BUN 33 (*) 6 - 23 mg/dL   Creatinine, Ser 1.97 (*) 0.50 - 1.10 mg/dL   Calcium 9.9  8.4 - 10.5 mg/dL   Total Protein 7.5  6.0 - 8.3 g/dL   Albumin 4.0  3.5 - 5.2 g/dL   AST 19  0 - 37 U/L   ALT 12  0 - 35 U/L   Alkaline Phosphatase 105  39 - 117 U/L   Total Bilirubin 0.2 (*) 0.3 - 1.2 mg/dL   GFR calc non Af Amer 25 (*) >90 mL/min   GFR calc Af Amer 30 (*) >90 mL/min   Comment: (NOTE)     The eGFR has been calculated using the CKD EPI equation.     This calculation has not been validated in all clinical situations.     eGFR's persistently <90 mL/min signify possible Chronic Kidney     Disease.   Anion gap 15  5 - 15  LIPASE, BLOOD     Status: Abnormal   Collection Time    12/02/13  8:01 AM      Result Value Ref Range   Lipase 69 (*) 11 - 59 U/L  TYPE AND SCREEN     Status: None   Collection Time    12/02/13  9:15 AM      Result Value Ref Range   ABO/RH(D) O POS     Antibody Screen NEG     Sample Expiration 12/05/2013     Unit Number [id number]     Blood Component Type RED CELLS,LR     Unit division 00     Status of Unit ISSUED,FINAL     Transfusion Status OK TO TRANSFUSE     Crossmatch Result Compatible     Unit Number [id number]     Blood Component Type RED CELLS,LR     Unit division 00     Status of Unit ISSUED,FINAL     Transfusion Status OK TO TRANSFUSE     Crossmatch Result Compatible    ABO/RH     Status: None   Collection Time    12/02/13  9:15 AM      Result Value Ref Range   ABO/RH(D) O POS    PREPARE RBC (CROSSMATCH)     Status: None   Collection Time    12/02/13  9:30 AM      Result Value Ref Range   Order Confirmation ORDER PROCESSED BY BLOOD BANK  PREPARE RBC (CROSSMATCH)     Status: None   Collection Time    12/02/13  5:00 PM      Result Value Ref Range   Order Confirmation ORDER PROCESSED BY BLOOD BANK    CBC     Status: Abnormal   Collection Time    12/02/13  5:05 PM      Result Value Ref Range   WBC 9.3  4.0 -  10.5 K/uL   RBC 4.48  3.87 - 5.11 MIL/uL   Hemoglobin 9.5 (*) 12.0 - 15.0 g/dL   Comment: POST TRANSFUSION SPECIMEN   HCT 31.0 (*) 36.0 - 46.0 %   MCV 69.2 (*) 78.0 - 100.0 fL   MCH 21.2 (*) 26.0 - 34.0 pg   MCHC 30.6  30.0 - 36.0 g/dL   RDW 19.2 (*) 11.5 - 15.5 %   Platelets 330  150 - 400 K/uL  BASIC METABOLIC PANEL     Status: Abnormal   Collection Time    12/03/13 12:30 AM      Result Value Ref Range   Sodium 140  137 - 147 mEq/L   Potassium 4.1  3.7 - 5.3 mEq/L   Chloride 101  96 - 112 mEq/L   CO2 24  19 - 32 mEq/L   Glucose, Bld 89  70 - 99 mg/dL   BUN 28 (*) 6 - 23 mg/dL   Creatinine, Ser 1.81 (*) 0.50 - 1.10 mg/dL   Calcium 9.7  8.4 - 10.5 mg/dL   GFR calc non Af Amer 28 (*) >90 mL/min   GFR calc Af Amer 33 (*) >90 mL/min   Comment: (NOTE)     The eGFR has been calculated using the CKD EPI equation.     This calculation has not been validated in all clinical situations.     eGFR's persistently <90 mL/min signify possible Chronic Kidney     Disease.   Anion gap 15  5 - 15  CBC     Status: Abnormal   Collection Time    12/03/13 12:30 AM      Result Value Ref Range   WBC 9.6  4.0 - 10.5 K/uL   RBC 4.73  3.87 - 5.11 MIL/uL   Hemoglobin 10.2 (*) 12.0 - 15.0 g/dL   HCT 32.7 (*) 36.0 - 46.0 %   MCV 69.1 (*) 78.0 - 100.0 fL   MCH 21.6 (*) 26.0 - 34.0 pg   MCHC 31.2  30.0 - 36.0 g/dL   RDW 20.0 (*) 11.5 - 15.5 %   Platelets 321  150 - 400 K/uL  CBC     Status: Abnormal   Collection Time    12/03/13  8:25 AM      Result Value Ref Range   WBC 6.6  4.0 - 10.5 K/uL   RBC 4.82  3.87 - 5.11 MIL/uL   Hemoglobin 10.5 (*) 12.0 - 15.0 g/dL   HCT 33.5 (*) 36.0 - 46.0 %   MCV 69.5 (*) 78.0 - 100.0 fL   MCH 21.8 (*) 26.0 - 34.0 pg   MCHC 31.3  30.0 - 36.0 g/dL   RDW 19.8 (*) 11.5 - 15.5 %   Platelets 317  150 - 400 K/uL   Ct Abdomen Pelvis W Contrast  12/02/2013   CLINICAL DATA:  Rectal bleeding  EXAM: CT ABDOMEN AND PELVIS WITH CONTRAST  TECHNIQUE: Multidetector CT imaging  of the abdomen and pelvis was performed using the standard protocol following bolus administration of  intravenous contrast.  CONTRAST:  56m OMNIPAQUE IOHEXOL 300 MG/ML  SOLN  COMPARISON:  06/19/2004  FINDINGS: The liver, gallbladder, spleen, pancreas, adrenal glands, kidneys are within normal limits  No free-fluid.  No abnormal retroperitoneal adenopathy.  Bladder is markedly distended.  No obvious rectal mass.  Mild L3-4 and L4-5 facet arthropathy.  IMPRESSION: Bladder distention.  No acute pathology.   Electronically Signed   By: AMaryclare BeanM.D.   On: 12/02/2013 10:45   Dg Chest Port 1 View  12/02/2013   CLINICAL DATA:  Rectal bleeding, hypertension, anemia  EXAM: PORTABLE CHEST - 1 VIEW  COMPARISON:  06/12/2009  FINDINGS: The heart size and mediastinal contours are within normal limits. Both lungs are clear. The visualized skeletal structures are unremarkable.  IMPRESSION: No active disease.   Electronically Signed   By: TDaryll BrodM.D.   On: 12/02/2013 09:10    Assessment: Acute on chronic GI bleeding suspect lower source, diverticular but cannot rule out neoplasm with previous insidious drop in hemoglobin Plan:  Will prep today for colonoscopy tomorrow. HWPYKD,XIPJC 12/03/2013, 10:17 AM

## 2013-12-03 NOTE — Progress Notes (Signed)
PATIENT DETAILS Name: Jamie Payne Age: 63 y.o. Sex: female Date of Birth: Sep 10, 1950 Admit Date: 12/02/2013 Admitting Physician Evalee Mutton Kristeen Mans, MD BJS:EGBTDV,VOHYW W, MD  Subjective: Doing well this morning. No acute issues. Denies pain or active bleeding. Has not had BM. Tolerated liquid diet. GI consulted to evaluate.   Assessment/Plan:   Primary Problem:  Hematochezia:  - most likely lower acute GI bleed, however concern for chronic GI loss given microcytic anemia. - Hgb 7.8 in ED - this morning Hgb increased to 10.5 after transfusion of 1 additional unit of PRBC upon admission yesterday (patient received 2 units PRBCs altogether) - [facility] consulted: anticipate colonoscopy--12/04/2013 - supportive care, PPI BID for now and frequent CBC checks. Transfuse prn.  - holding BP medications--blood pressure remains stable   Active Problems:  Acute blood loss anemia - microcytic; suggestive of chronic blood loss anemia, with acute anemia secondary to hematochezia  - Hgb was 13 prior to July 2015;  7.8 in ED yesterday; currently 10.5 after transfusion  - 30lb weight loss within past 6 months; intentional, yet still concerning  -appreciate Dr. Amedeo Plenty Acute on Chronic Renal Failure (CKD stage 3):  - creatinine down to 1.81; trending downward with IVF  - anticipate pre-renal, hypovolemic cause  - continue IVF supplementation  -baseline creatinine 1.2-1.5 Hypertension:  - controlled at home with amlodipine and Maxzide  - on hold presently -BP remains stable off anti-HTN  Glaucoma:  - stable  - Continue Combigan and Travatan  Disposition: Remain inpatient  DVT Prophylaxis: SCD's  Code Status: Full code  Family Communication None at bedside  Procedures:  None  CONSULTS:  GI  Time spent 40 minutes-which includes 50% of the time with face-to-face with patient/ family and coordinating care related to the above assessment and  plan.    MEDICATIONS: Scheduled Meds: . brimonidine  1 drop Both Eyes BID   And  . timolol  1 drop Both Eyes BID  . diphenhydrAMINE  25 mg Intravenous Once  . pantoprazole (PROTONIX) IV  40 mg Intravenous Q12H  . pneumococcal 23 valent vaccine  0.5 mL Intramuscular Tomorrow-1000  . travoprost (benzalkonium)  1 drop Both Eyes QHS   Continuous Infusions: . sodium chloride 75 mL/hr at 12/02/13 2151   PRN Meds:.acetaminophen, acetaminophen, albuterol, guaiFENesin-dextromethorphan, ondansetron (ZOFRAN) IV, ondansetron  Antibiotics: Anti-infectives   None       PHYSICAL EXAM: Vital signs in last 24 hours: Filed Vitals:   12/02/13 1916 12/02/13 2017 12/02/13 2112 12/03/13 0412  BP: 140/91 147/80 134/82 129/77  Pulse: 76 72 68 70  Temp: 98.4 F (36.9 C) 98.4 F (36.9 C) 98.4 F (36.9 C) 97.9 F (36.6 C)  TempSrc: Oral Oral Oral Oral  Resp: 16 17 16 16   Height:      Weight:      SpO2: 99% 98% 95% 98%    Weight change:  Filed Weights   12/02/13 0739 12/02/13 1642  Weight: 91.627 kg (202 lb) 93.622 kg (206 lb 6.4 oz)   Body mass index is 35.41 kg/(m^2).   Gen Exam: Awake and alert with clear speech.   Neck: Supple, No JVD.   Chest: B/L Clear.   CVS: S1 S2 Regular, soft systolic murmur.  Abdomen: soft, BS +, non tender, non distended.  Extremities: trace LE edema, lower extremities warm to touch. Neurologic: Non Focal.   Skin: No Rash.   Wounds: N/A.    Intake/Output from previous day:  Intake/Output  Summary (Last 24 hours) at 12/03/13 0912 Last data filed at 12/02/13 1817  Gross per 24 hour  Intake  637.5 ml  Output      0 ml  Net  637.5 ml     LAB RESULTS: CBC  Recent Labs Lab 12/02/13 0801 12/02/13 1705 12/03/13 0030  WBC 5.9 9.3 9.6  HGB 7.8* 9.5* 10.2*  HCT 26.5* 31.0* 32.7*  PLT 324 330 321  MCV 67.3* 69.2* 69.1*  MCH 19.8* 21.2* 21.6*  MCHC 29.4* 30.6 31.2  RDW 19.4* 19.2* 20.0*  LYMPHSABS 1.5  --   --   MONOABS 0.4  --   --    EOSABS 0.2  --   --   BASOSABS 0.0  --   --     Chemistries   Recent Labs Lab 12/02/13 0801 12/03/13 0030  NA 141 140  K 4.2 4.1  CL 104 101  CO2 22 24  GLUCOSE 120* 89  BUN 33* 28*  CREATININE 1.97* 1.81*  CALCIUM 9.9 9.7   No components found with this basename: POCBNP,  No results found for this basename: DDIMER,  in the last 72 hours No results found for this basename: HGBA1C,  in the last 72 hours No results found for this basename: CHOL, HDL, LDLCALC, TRIG, CHOLHDL, LDLDIRECT,  in the last 72 hours No results found for this basename: TSH, T4TOTAL, FREET3, T3FREE, THYROIDAB,  in the last 72 hours No results found for this basename: VITAMINB12, FOLATE, FERRITIN, TIBC, IRON, RETICCTPCT,  in the last 72 hours  Recent Labs  12/02/13 0801  LIPASE 69*    RADIOLOGY STUDIES/RESULTS: Ct Abdomen Pelvis W Contrast  12/02/2013   CLINICAL DATA:  Rectal bleeding  EXAM: CT ABDOMEN AND PELVIS WITH CONTRAST  TECHNIQUE: Multidetector CT imaging of the abdomen and pelvis was performed using the standard protocol following bolus administration of intravenous contrast.  CONTRAST:  57mL OMNIPAQUE IOHEXOL 300 MG/ML  SOLN  COMPARISON:  06/19/2004  FINDINGS: The liver, gallbladder, spleen, pancreas, adrenal glands, kidneys are within normal limits  No free-fluid.  No abnormal retroperitoneal adenopathy.  Bladder is markedly distended.  No obvious rectal mass.  Mild L3-4 and L4-5 facet arthropathy.  IMPRESSION: Bladder distention.  No acute pathology.   Electronically Signed   By: Maryclare Bean M.D.   On: 12/02/2013 10:45   Dg Chest Port 1 View  12/02/2013   CLINICAL DATA:  Rectal bleeding, hypertension, anemia  EXAM: PORTABLE CHEST - 1 VIEW  COMPARISON:  06/12/2009  FINDINGS: The heart size and mediastinal contours are within normal limits. Both lungs are clear. The visualized skeletal structures are unremarkable.  IMPRESSION: No active disease.   Electronically Signed   By: Daryll Brod M.D.   On:  12/02/2013 09:10    Storm, [employer]   [facility] Pager:[phone]  If 7PM-7AM, please contact night-coverage [email or web address] Password [email or web address] 12/03/2013, 9:12 AM   LOS: 1 day   **Disclaimer: This note may have been dictated with voice recognition software. Similar sounding words can inadvertently be transcribed and this note may contain transcription errors which may not have been corrected upon publication of note.**

## 2013-12-03 NOTE — Progress Notes (Signed)
Pt unable to finish all of the GoLYTELY. Approximately 3584ml ingested. Will continue to monitor.

## 2013-12-04 ENCOUNTER — Encounter (HOSPITAL_COMMUNITY)
Admission: EM | Disposition: A | Discharge status: Home Health | Payer: Self-pay | Source: Home / Self Care | Attending: Internal Medicine

## 2013-12-04 ENCOUNTER — Inpatient Hospital Stay (HOSPITAL_COMMUNITY): Payer: Medicare HMO | Admitting: Anesthesiology

## 2013-12-04 ENCOUNTER — Encounter (HOSPITAL_COMMUNITY): Payer: Medicare HMO | Admitting: Anesthesiology

## 2013-12-04 ENCOUNTER — Encounter (HOSPITAL_COMMUNITY): Payer: Self-pay | Admitting: Anesthesiology

## 2013-12-04 HISTORY — PX: COLONOSCOPY: SHX5424

## 2013-12-04 LAB — BASIC METABOLIC PANEL
Anion gap: 14 (ref 5–15)
BUN: 24 mg/dL — AB (ref 6–23)
CHLORIDE: 102 meq/L (ref 96–112)
CO2: 24 mEq/L (ref 19–32)
CREATININE: 1.67 mg/dL — AB (ref 0.50–1.10)
Calcium: 9.7 mg/dL (ref 8.4–10.5)
GFR calc non Af Amer: 31 mL/min — ABNORMAL LOW (ref >90)
GFR, EST AFRICAN AMERICAN: 36 mL/min — AB (ref >90)
Glucose, Bld: 91 mg/dL (ref 70–99)
Potassium: 4 mEq/L (ref 3.7–5.3)
Sodium: 140 mEq/L (ref 137–147)

## 2013-12-04 LAB — CBC
HEMATOCRIT: 31.5 % — AB (ref 36.0–46.0)
Hemoglobin: 9.8 g/dL — ABNORMAL LOW (ref 12.0–15.0)
MCH: 21.6 pg — AB (ref 26.0–34.0)
MCHC: 31.1 g/dL (ref 30.0–36.0)
MCV: 69.5 fL — AB (ref 78.0–100.0)
Platelets: 311 10*3/uL (ref 150–400)
RBC: 4.53 MIL/uL (ref 3.87–5.11)
RDW: 20.3 % — AB (ref 11.5–15.5)
WBC: 8.9 10*3/uL (ref 4.0–10.5)

## 2013-12-04 SURGERY — COLONOSCOPY
Anesthesia: Monitor Anesthesia Care

## 2013-12-04 MED ORDER — POLYSACCHARIDE IRON COMPLEX 150 MG PO CAPS
150.0000 mg | ORAL_CAPSULE | Freq: Every day | ORAL | Status: DC
  Administered 2013-12-04: 150 mg via ORAL
  Filled 2013-12-04 (×2): qty 1

## 2013-12-04 MED ORDER — AMLODIPINE BESYLATE 10 MG PO TABS
10.0000 mg | ORAL_TABLET | Freq: Every day | ORAL | Status: DC
  Administered 2013-12-04: 10 mg via ORAL
  Filled 2013-12-04 (×3): qty 1

## 2013-12-04 MED ORDER — FENTANYL CITRATE 0.05 MG/ML IJ SOLN
INTRAMUSCULAR | Status: DC | PRN
  Administered 2013-12-04: 75 ug via INTRAVENOUS
  Administered 2013-12-04: 25 ug via INTRAVENOUS

## 2013-12-04 MED ORDER — MIDAZOLAM HCL 5 MG/5ML IJ SOLN
INTRAMUSCULAR | Status: DC | PRN
  Administered 2013-12-04: 0.5 mg via INTRAVENOUS
  Administered 2013-12-04: 1.5 mg via INTRAVENOUS

## 2013-12-04 MED ORDER — PROPOFOL INFUSION 10 MG/ML OPTIME
INTRAVENOUS | Status: DC | PRN
  Administered 2013-12-04: 50 ug/kg/min via INTRAVENOUS

## 2013-12-04 MED ORDER — EPHEDRINE SULFATE 50 MG/ML IJ SOLN
INTRAMUSCULAR | Status: DC | PRN
  Administered 2013-12-04: 10 mg via INTRAVENOUS

## 2013-12-04 MED ORDER — LACTATED RINGERS IV SOLN
INTRAVENOUS | Status: DC
  Administered 2013-12-04: 1000 mL via INTRAVENOUS

## 2013-12-04 MED ORDER — LACTATED RINGERS IV SOLN
INTRAVENOUS | Status: DC | PRN
  Administered 2013-12-04: 09:00:00 via INTRAVENOUS

## 2013-12-04 MED ORDER — SODIUM CHLORIDE 0.9 % IV SOLN
INTRAVENOUS | Status: DC
  Administered 2013-12-04: 20 mL/h via INTRAVENOUS

## 2013-12-04 MED ORDER — ONDANSETRON HCL 4 MG/2ML IJ SOLN
INTRAMUSCULAR | Status: DC | PRN
  Administered 2013-12-04: 4 mg via INTRAVENOUS

## 2013-12-04 MED ORDER — FENTANYL CITRATE 0.05 MG/ML IJ SOLN: 25.0000 ug | INTRAMUSCULAR | Status: DC | PRN

## 2013-12-04 NOTE — Care Management Note (Addendum)
    Page 1 of 1   12/04/2013     4:30:49 PM CARE MANAGEMENT NOTE 12/04/2013  Patient:  Jamie Payne, Jamie Payne   Account Number:  [id number]  Date Initiated:  12/04/2013  Documentation initiated by:  Tomi Bamberger  Subjective/Objective Assessment:   dx , rectal  bleeding  admit- from home     Action/Plan:   8/7 colonscopy   Anticipated DC Date:  12/04/2013   Anticipated DC Plan:  [place]  CM consult      [facility] Choice  HOME HEALTH   Choice offered to / List presented to:  C-1 Patient           Status of service:  Completed, signed off Medicare Important Message given?  YES (If response is "NO", the following Medicare IM given date fields will be blank) Date Medicare IM given:  12/04/2013 Medicare IM given by:  Tomi Bamberger Date Additional Medicare IM given:   Additional Medicare IM given by:    Discharge Disposition:  [place]  Per UR Regulation:  Reviewed for med. necessity/level of care/duration of stay  If discussed at [place] of Stay Meetings, dates discussed:    Comments:  12/04/13 [place], BSN [phone] patient from home, for colonscopy today, if ok may possibly dc today.  NCM spoke with patient , she chose [facility] for HHPT, referral made to [facility] for hhpt, Butch Penny notified. Soc will begin 24-48 hrs post dc.

## 2013-12-04 NOTE — Interval H&P Note (Signed)
History and Physical Interval Note:  12/04/2013 9:13 AM  Jamie Payne  has presented today for surgery, with the diagnosis of rectal bleeding  The various methods of treatment have been discussed with the patient and family. After consideration of risks, benefits and other options for treatment, the patient has consented to  Procedure(s): COLONOSCOPY (N/A) as a surgical intervention .  The patient's history has been reviewed, patient examined, no change in status, stable for surgery.  I have reviewed the patient's chart and labs.  Questions were answered to the patient's satisfaction.     Jamie Payne C

## 2013-12-04 NOTE — Progress Notes (Signed)
Colonoscopy done. Prep only fair but adequate after lavage. Surprisingly only one diverticulum seen and small internal hemorrhoids with no old or fresh blood. Therefore will need EGD which we'll set up for tomorrow.

## 2013-12-04 NOTE — Op Note (Signed)
[facility] [place], [address]   COLONOSCOPY PROCEDURE REPORT  PATIENT: Jamie, Payne  MR#: [id number] BIRTHDATE: August 12, 1950 , 1  yrs. old GENDER: Female ENDOSCOPIST: Teena Irani, MD REFERRED BY: PROCEDURE DATE:  12/04/2013 PROCEDURE: ASA CLASS: INDICATIONS:  rectal bleeding, anemia and heme positive stool MEDICATIONS:    MAC  DESCRIPTION OF PROCEDURE: Pentax videocolonoscope inserted into the rectum and advanced to the cecum identified by the appendiceal orifice and intubation of the terminal ileum which was normal. Prep was fair and required significant lavage but ultimately was adequate to rule out lesions greater than 5 millimeter. The cecum descending and transverse colon all appeared normal with no masses polyps diverticula or other mucosal abnormalities. There was a single sigmoid diverticulum that was visualized. The rectum appeared normal on retroflexed view of the anus revealed moderate internal hemorrhoids. There was no old or fresh blood seen anywhere within the colon.     COMPLICATIONS: None  ENDOSCOPIC IMPRESSION:single diverticulum and internal hemorrhoids otherwise surprisingly normal study given the presentation  RECOMMENDATIONS:patient will need upper endoscopy and we'll set this up for tomorrow.    _______________________________ Lorrin MaisTeena Irani, MD 12/04/2013 10:07 AM

## 2013-12-04 NOTE — Transfer of Care (Signed)
Immediate Anesthesia Transfer of Care Note  Patient: Jamie Payne  Procedure(s) Performed: Procedure(s): COLONOSCOPY (N/A)  Patient Location: Endoscopy Unit  Anesthesia Type:MAC  Level of Consciousness: awake, alert  and oriented  Airway & Oxygen Therapy: Patient Spontanous Breathing and Patient connected to face mask oxygen  Post-op Assessment: Report given to PACU RN  Post vital signs: Reviewed and stable  Complications: No apparent anesthesia complications

## 2013-12-04 NOTE — H&P (View-Only) (Signed)
Pt unable to finish all of the GoLYTELY. Approximately 3540ml ingested. Will continue to monitor.

## 2013-12-04 NOTE — Progress Notes (Addendum)
PATIENT DETAILS Name: Jamie Payne Age: 63 y.o. Sex: female Date of Birth: Aug 29, 1950 Admit Date: 12/02/2013 Admitting Physician Evalee Mutton Kristeen Mans, MD BSW:[id number],MBWGY W, MD  Subjective: Doing well. No complaints. No blood noticed in stools during colonoscopy prep.  Assessment/Plan: Active Problems:   GI bleed   Acute blood loss anemia   ARF (acute renal failure)   HTN (hypertension)   Glaucoma   Acute on chronic renal failure   Primary Problem:  Hematochezia:  -original impression: most likely lower acute GI bleed, however concern for chronic GI loss given microcytic anemia.  -Hgb 7.8 in ED; hemoglobin at 9.8 today and appears stable after receiving two total units of PRBCs since admission -[facility] consulted: colonoscopy completed 8/7 (under MAC) found 1 diverticulum and small internal hemorrhoids with no blood.  -EGD scheduled for 8/8 -Supportive care and monitoring.  Active Problems:  Acute blood loss anemia  - microcytic; suggestive of chronic blood loss anemia, with acute anemia secondary to hematochezia  - Hgb was 13 prior to July 2015; 7.8 in ED on admit; currently 9.8 after transfusion.  - 30lb weight loss within past 6 months; intentional, yet still concerning  - appreciate Dr. Amedeo Plenty' consultation/evaluation.  Microcytic Anemia -Previous history of PUD. -EGD scheduled for 8/8 -Will start iron polysaccharide as it is less constipating. -PPI BID for now   Acute on Chronic Renal Failure (CKD stage 3):  - creatinine down to 1.67; continues to trend downward with IVF  - anticipate pre-renal, hypovolemic cause.  Patient was on HCTZ which is now being held. - continue IVF supplementation  - baseline creatinine 1.2-1.5   Hypertension:  - controlled at home with amlodipine and Maxzide  - These were discontinued on admission - BP rising today as patient improves.  Will restart amlodipine but continue to hold Maxzide.  Given patient's 30 lb wt loss she may  require a reduction in her antihypertensive medications.  Glaucoma:  - stable  - Continue Combigan and Travatan  Disposition: Remain inpatient. EGD tomorrow  DVT Prophylaxis: SCD's  Code Status: Full code   Family Communication None  Procedures:  Colonscopy 8/7  CONSULTS:  GI  Time spent 40 minutes-which includes 50% of the time with face-to-face with patient/ family and coordinating care related to the above assessment and plan.    MEDICATIONS: Scheduled Meds: . amLODipine  10 mg Oral Daily  . brimonidine  1 drop Both Eyes BID   And  . timolol  1 drop Both Eyes BID  . diphenhydrAMINE  25 mg Intravenous Once  . iron polysaccharides  150 mg Oral Daily  . pantoprazole (PROTONIX) IV  40 mg Intravenous Q12H  . travoprost (benzalkonium)  1 drop Both Eyes QHS   Continuous Infusions: . sodium chloride 20 mL/hr (12/03/13 1045)  . sodium chloride 20 mL/hr (12/04/13 1137)   PRN Meds:.acetaminophen, acetaminophen, albuterol, fentaNYL, guaiFENesin-dextromethorphan, hydrALAZINE, ondansetron (ZOFRAN) IV, ondansetron  Antibiotics: Anti-infectives   None       PHYSICAL EXAM: Vital signs in last 24 hours: Filed Vitals:   12/04/13 1005 12/04/13 1020 12/04/13 1035 12/04/13 1050  BP: 106/72 126/67 145/72 145/72  Pulse: 69 71 68 64  Temp: 97.7 F (36.5 C)     TempSrc: Oral     Resp: 13 17 17 15   Height:      Weight:      SpO2: 98% 95% 95% 95%    Weight change:  Filed Weights   12/02/13 0739  12/02/13 1642  Weight: 91.627 kg (202 lb) 93.622 kg (206 lb 6.4 oz)   Body mass index is 35.41 kg/(m^2).   Gen Exam: Awake and alert with clear speech.  Well appearing. Neck: Supple, No JVD.   Chest: B/L Clear.   CVS: S1 S2 Regular, no murmurs.  Abdomen: soft, BS +, non tender, non distended, no organomegaly Extremities: no edema, lower extremities warm to touch. Neurologic: Non Focal.     Intake/Output from previous day:  Intake/Output Summary (Last 24 hours) at  12/04/13 1349 Last data filed at 12/04/13 1007  Gross per 24 hour  Intake 4290.33 ml  Output      0 ml  Net 4290.33 ml     LAB RESULTS: CBC  Recent Labs Lab 12/02/13 0801 12/02/13 1705 12/03/13 0030 12/03/13 0825 12/03/13 1539 12/04/13 0032  WBC 5.9 9.3 9.6 6.6 7.5 8.9  HGB 7.8* 9.5* 10.2* 10.5* 10.4* 9.8*  HCT 26.5* 31.0* 32.7* 33.5* 33.6* 31.5*  PLT 324 330 321 317 341 311  MCV 67.3* 69.2* 69.1* 69.5* 69.9* 69.5*  MCH 19.8* 21.2* 21.6* 21.8* 21.6* 21.6*  MCHC 29.4* 30.6 31.2 31.3 31.0 31.1  RDW 19.4* 19.2* 20.0* 19.8* 20.1* 20.3*  LYMPHSABS 1.5  --   --   --   --   --   MONOABS 0.4  --   --   --   --   --   EOSABS 0.2  --   --   --   --   --   BASOSABS 0.0  --   --   --   --   --     Chemistries   Recent Labs Lab 12/02/13 0801 12/03/13 0030 12/04/13 0032  NA 141 140 140  K 4.2 4.1 4.0  CL 104 101 102  CO2 22 24 24   GLUCOSE 120* 89 91  BUN 33* 28* 24*  CREATININE 1.97* 1.81* 1.67*  CALCIUM 9.9 9.7 9.7    Recent Labs  12/02/13 0801  LIPASE 69*    RADIOLOGY STUDIES/RESULTS: Ct Abdomen Pelvis W Contrast  12/02/2013   CLINICAL DATA:  Rectal bleeding  EXAM: CT ABDOMEN AND PELVIS WITH CONTRAST  TECHNIQUE: Multidetector CT imaging of the abdomen and pelvis was performed using the standard protocol following bolus administration of intravenous contrast.  CONTRAST:  29mL OMNIPAQUE IOHEXOL 300 MG/ML  SOLN  COMPARISON:  06/19/2004  FINDINGS: The liver, gallbladder, spleen, pancreas, adrenal glands, kidneys are within normal limits  No free-fluid.  No abnormal retroperitoneal adenopathy.  Bladder is markedly distended.  No obvious rectal mass.  Mild L3-4 and L4-5 facet arthropathy.  IMPRESSION: Bladder distention.  No acute pathology.   Electronically Signed   By: Maryclare Bean M.D.   On: 12/02/2013 10:45   Dg Chest Port 1 View  12/02/2013   CLINICAL DATA:  Rectal bleeding, hypertension, anemia  EXAM: PORTABLE CHEST - 1 VIEW  COMPARISON:  06/12/2009  FINDINGS: The heart  size and mediastinal contours are within normal limits. Both lungs are clear. The visualized skeletal structures are unremarkable.  IMPRESSION: No active disease.   Electronically Signed   By: Daryll Brod M.D.   On: 12/02/2013 09:10    Danna Hefty, PA-S, [place], [place] [facility] Pager:[phone]  If 7PM-7AM, please contact night-coverage [email or web address] Password [email or web address] 12/04/2013, 1:49 PM   LOS: 2 days   **Disclaimer: This note may have been dictated with voice recognition software. Similar sounding words can inadvertently be transcribed and  this note may contain transcription errors which may not have been corrected upon publication of note.**  Attending  Patient was seen, examined,treatment plan was discussed with the Physician extender. I have directly reviewed the clinical findings, lab, imaging studies and management of this patient in detail. I have made the necessary changes to the above noted documentation, and agree with the documentation, as recorded by the Physician extender.  Please see attending note. Orson Eva, DO

## 2013-12-04 NOTE — Anesthesia Preprocedure Evaluation (Addendum)
Anesthesia Evaluation  Patient identified by MRN, date of birth, ID band Patient awake    Reviewed: Allergy & Precautions, H&P , NPO status , Patient's Chart, lab work & pertinent test results, reviewed documented beta blocker date and time   Airway Mallampati: II TM Distance: >3 FB     Dental  (+) Edentulous Upper, Edentulous Lower   Pulmonary neg pulmonary ROS,  breath sounds clear to auscultation  Pulmonary exam normal       Cardiovascular hypertension, Pt. on medications Rhythm:Regular Rate:Normal     Neuro/Psych    GI/Hepatic History noted. Ce   Endo/Other    Renal/GU ARF and CRFRenal disease     Musculoskeletal   Abdominal Normal abdominal exam  (+)   Peds  Hematology  (+) anemia ,   Anesthesia Other Findings   Reproductive/Obstetrics                          Anesthesia Physical Anesthesia Plan  ASA: III  Anesthesia Plan: MAC   Post-op Pain Management:    Induction: Intravenous  Airway Management Planned: Mask  Additional Equipment:   Intra-op Plan:   Post-operative Plan:   Informed Consent: I have reviewed the patients History and Physical, chart, labs and discussed the procedure including the risks, benefits and alternatives for the proposed anesthesia with the patient or authorized representative who has indicated his/her understanding and acceptance.   Dental advisory given  Plan Discussed with: CRNA, Anesthesiologist and Surgeon  Anesthesia Plan Comments:         Anesthesia Quick Evaluation

## 2013-12-04 NOTE — Progress Notes (Signed)
PROGRESS NOTE  Jamie Payne URK:[id number] DOB: 1950-12-11 DOA: 12/02/2013 PCP: Gara Kroner, MD  Assessment/Plan: Hematochezia:  - most likely lower acute GI bleed, however concern for chronic GI loss given microcytic anemia.  - Hgb 7.8 in ED  - Hgb increased to 10.5 after transfusion of 1 additional unit of PRBC upon admission yesterday (patient received 2 units PRBCs altogether)  - colonoscopy--12/04/2013--single diverticulum with internal hemorrhoids -Plan as noted for EGD 12/05/2013  - Hemoglobin remained stable, discontinue serial CBCs Acute blood loss anemia  - microcytic; suggestive of chronic blood loss anemia, with acute anemia secondary to hematochezia  - Hgb was 13 prior to July 2015; 7.8 in ED; currently 10.5 after transfusion  - 30lb weight loss within past 6 months; intentional, yet still concerning  -appreciate Dr. Amedeo Plenty  Acute on Chronic Renal Failure (CKD stage 3):  - creatinine down to 1.81; trending downward with IVF  - anticipate pre-renal, hypovolemic cause  - continue IVF supplementation  -baseline creatinine 1.2-1.5  Hypertension:  - controlled at home with amlodipine and Maxzide  - Maxzide on hold presently due to AKI -BP remains stable  Glaucoma:  - stable  - Continue Combigan and Travatan    Family Communication:   Pt at beside Disposition Plan:   Home when medically stable       Procedures/Studies: Ct Abdomen Pelvis W Contrast  12/02/2013   CLINICAL DATA:  Rectal bleeding  EXAM: CT ABDOMEN AND PELVIS WITH CONTRAST  TECHNIQUE: Multidetector CT imaging of the abdomen and pelvis was performed using the standard protocol following bolus administration of intravenous contrast.  CONTRAST:  40mL OMNIPAQUE IOHEXOL 300 MG/ML  SOLN  COMPARISON:  06/19/2004  FINDINGS: The liver, gallbladder, spleen, pancreas, adrenal glands, kidneys are within normal limits  No free-fluid.  No abnormal retroperitoneal adenopathy.  Bladder is markedly distended.   No obvious rectal mass.  Mild L3-4 and L4-5 facet arthropathy.  IMPRESSION: Bladder distention.  No acute pathology.   Electronically Signed   By: Jamie Payne M.D.   On: 12/02/2013 10:45   Dg Chest Port 1 View  12/02/2013   CLINICAL DATA:  Rectal bleeding, hypertension, anemia  EXAM: PORTABLE CHEST - 1 VIEW  COMPARISON:  06/12/2009  FINDINGS: The heart size and mediastinal contours are within normal limits. Both lungs are clear. The visualized skeletal structures are unremarkable.  IMPRESSION: No active disease.   Electronically Signed   By: Jamie Payne M.D.   On: 12/02/2013 09:10         Subjective: Patient denies fevers, chills, headache, chest pain, dyspnea, nausea, vomiting, diarrhea, abdominal pain, dysuria, hematuria   Objective: Filed Vitals:   12/04/13 1020 12/04/13 1035 12/04/13 1050 12/04/13 1438  BP: 126/67 145/72 145/72 132/83  Pulse: 71 68 64 61  Temp:    97.4 F (36.3 C)  TempSrc:    Oral  Resp: 17 17 15 16   Height:      Weight:      SpO2: 95% 95% 95% 97%    Intake/Output Summary (Last 24 hours) at 12/04/13 1614 Last data filed at 12/04/13 1007  Gross per 24 hour  Intake   2200 ml  Output      0 ml  Net   2200 ml   Weight change:  Exam:   General:  Pt is alert, follows commands appropriately, not in acute distress  HEENT: No icterus, No thrush, No neck mass, [place]/AT  Cardiovascular: RRR, S1/S2,  no rubs, no gallops  Respiratory: CTA bilaterally, no wheezing, no crackles, no rhonchi  Abdomen: Soft/+BS, non tender, non distended, no guarding  Extremities: trace LE edema, No lymphangitis, No petechiae, No rashes, no synovitis  Data Reviewed: Basic Metabolic Panel:  Recent Labs Lab 12/02/13 0801 12/03/13 0030 12/04/13 0032  NA 141 140 140  K 4.2 4.1 4.0  CL 104 101 102  CO2 22 24 24   GLUCOSE 120* 89 91  BUN 33* 28* 24*  CREATININE 1.97* 1.81* 1.67*  CALCIUM 9.9 9.7 9.7   Liver Function Tests:  Recent Labs Lab 12/02/13 0801  AST 19  ALT  12  ALKPHOS 105  BILITOT 0.2*  PROT 7.5  ALBUMIN 4.0    Recent Labs Lab 12/02/13 0801  LIPASE 69*   No results found for this basename: AMMONIA,  in the last 168 hours CBC:  Recent Labs Lab 12/02/13 0801 12/02/13 1705 12/03/13 0030 12/03/13 0825 12/03/13 1539 12/04/13 0032  WBC 5.9 9.3 9.6 6.6 7.5 8.9  NEUTROABS 3.8  --   --   --   --   --   HGB 7.8* 9.5* 10.2* 10.5* 10.4* 9.8*  HCT 26.5* 31.0* 32.7* 33.5* 33.6* 31.5*  MCV 67.3* 69.2* 69.1* 69.5* 69.9* 69.5*  PLT 324 330 321 317 341 311   Cardiac Enzymes: No results found for this basename: CKTOTAL, CKMB, CKMBINDEX, TROPONINI,  in the last 168 hours BNP: No components found with this basename: POCBNP,  CBG: No results found for this basename: GLUCAP,  in the last 168 hours  No results found for this or any previous visit (from the past 240 hour(s)).   Scheduled Meds: . amLODipine  10 mg Oral Daily  . brimonidine  1 drop Both Eyes BID   And  . timolol  1 drop Both Eyes BID  . diphenhydrAMINE  25 mg Intravenous Once  . iron polysaccharides  150 mg Oral Daily  . pantoprazole (PROTONIX) IV  40 mg Intravenous Q12H  . travoprost (benzalkonium)  1 drop Both Eyes QHS   Continuous Infusions: . sodium chloride 20 mL/hr (12/03/13 1045)  . sodium chloride 20 mL/hr (12/04/13 1137)     Jamie Knoche, DO  [facility] Pager [phone]  If 7PM-7AM, please contact night-coverage [email or web address] Password [email or web address] 12/04/2013, 4:14 PM   LOS: 2 days

## 2013-12-04 NOTE — Anesthesia Postprocedure Evaluation (Signed)
  Anesthesia Post-op Note  Patient: Jamie Payne  Procedure(s) Performed: Procedure(s): COLONOSCOPY (N/A)  Patient Location: PACU  Anesthesia Type:MAC  Level of Consciousness: awake  Airway and Oxygen Therapy: Patient Spontanous Breathing  Post-op Pain: mild  Post-op Assessment: Post-op Vital signs reviewed  Post-op Vital Signs: Reviewed  Last Vitals:  Filed Vitals:   12/04/13 1050  BP: 145/72  Pulse: 64  Temp:   Resp: 15    Complications: No apparent anesthesia complications

## 2013-12-05 ENCOUNTER — Encounter (HOSPITAL_COMMUNITY)
Admission: EM | Disposition: A | Discharge status: Home Health | Payer: Self-pay | Source: Home / Self Care | Attending: Internal Medicine

## 2013-12-05 ENCOUNTER — Encounter (HOSPITAL_COMMUNITY): Payer: Self-pay | Admitting: Gastroenterology

## 2013-12-05 DIAGNOSIS — H409 Unspecified glaucoma: Secondary | ICD-10-CM

## 2013-12-05 HISTORY — PX: ESOPHAGOGASTRODUODENOSCOPY: SHX5428

## 2013-12-05 LAB — CBC
HCT: 33.9 % — ABNORMAL LOW (ref 36.0–46.0)
HEMOGLOBIN: 10.3 g/dL — AB (ref 12.0–15.0)
MCH: 21.4 pg — AB (ref 26.0–34.0)
MCHC: 30.4 g/dL (ref 30.0–36.0)
MCV: 70.3 fL — AB (ref 78.0–100.0)
PLATELETS: 348 10*3/uL (ref 150–400)
RBC: 4.82 MIL/uL (ref 3.87–5.11)
RDW: 20.7 % — ABNORMAL HIGH (ref 11.5–15.5)
WBC: 7.5 10*3/uL (ref 4.0–10.5)

## 2013-12-05 SURGERY — EGD (ESOPHAGOGASTRODUODENOSCOPY)
Anesthesia: Moderate Sedation

## 2013-12-05 MED ORDER — MIDAZOLAM HCL 5 MG/ML IJ SOLN
INTRAMUSCULAR | Status: AC
  Filled 2013-12-05: qty 2

## 2013-12-05 MED ORDER — FENTANYL CITRATE 0.05 MG/ML IJ SOLN
INTRAMUSCULAR | Status: AC
  Filled 2013-12-05: qty 2

## 2013-12-05 MED ORDER — POLYSACCHARIDE IRON COMPLEX 150 MG PO CAPS: 150.0000 mg | ORAL_CAPSULE | Freq: Every day | ORAL | Status: DC

## 2013-12-05 MED ORDER — BUTAMBEN-TETRACAINE-BENZOCAINE 2-2-14 % EX AERO
INHALATION_SPRAY | CUTANEOUS | Status: DC | PRN
  Administered 2013-12-05: 2 via TOPICAL

## 2013-12-05 MED ORDER — MIDAZOLAM HCL 10 MG/2ML IJ SOLN
INTRAMUSCULAR | Status: DC | PRN
  Administered 2013-12-05: 2 mg via INTRAVENOUS
  Administered 2013-12-05 (×2): 1 mg via INTRAVENOUS
  Administered 2013-12-05: 2 mg via INTRAVENOUS

## 2013-12-05 MED ORDER — UNABLE TO FIND: Status: DC

## 2013-12-05 MED ORDER — FENTANYL CITRATE 0.05 MG/ML IJ SOLN
INTRAMUSCULAR | Status: DC | PRN
  Administered 2013-12-05 (×2): 25 ug via INTRAVENOUS

## 2013-12-05 NOTE — Progress Notes (Signed)
Pt discharged to home. Pt. Is alert and oriented. Pt is hemodynamically stable. IV taken out. AVS reviewed with pt. Capable of re verbalizing medication regimen. Discharge plan appropriate and in place.

## 2013-12-05 NOTE — Progress Notes (Signed)
Jamie Payne 8:41 AM  Subjective: Patient without any signs of bleeding or any problem from her colonoscopy and no other complaints  Objective: Vital signs stable afebrile no acute distress abdomen is soft nontender exam please see pre-assessment evaluation hemoglobin stable CT negative colonoscopy report reviewed  Assessment: GI bleeding and anemia  Plan: Okay to proceed with endoscopy later today and if no significant findings can probably go home today without patient followup with Dr. Amedeo Plenty who can decide any other GI workup or plan like possibly capsule endoscopy as an outpatient if needed  [facility] E

## 2013-12-05 NOTE — Discharge Summary (Signed)
Physician Discharge Summary  NATALE BARBA MEQ:[id number] DOB: 1950/09/29 DOA: 12/02/2013  PCP: Gara Kroner, MD  Admit date: 12/02/2013 Discharge date: 12/05/2013  Recommendations for Outpatient Follow-up:  1. Pt will need to follow up with PCP in 2 weeks post discharge 2. Please obtain BMP one week 3. Please also check CBC in one week   Discharge Diagnoses:  Hematochezia:  - most likely lower acute GI bleed, however concern for chronic GI loss given microcytic anemia.  - Hgb 7.8 in ED  - Hgb increased to 10.5 after transfusion of 1 additional unit of PRBC upon admission (patient received 2 units PRBCs altogether during admission)  -Hemoglobin remained stable after 2 units PRBC without any other evidence of blood loss - colonoscopy--12/04/2013--single diverticulum with internal hemorrhoids  -EGD 12/05/2013--revealed small hiatus hernia, multiple small to medium polyps in the cardia and antrum of the stomach, and small duodenal diverticulum--no source of bleeding noted  Acute blood loss anemia  - microcytic; suggestive of chronic blood loss anemia, with acute anemia secondary to hematochezia  -pt started on niferex, one daily - Hgb was 13 prior to July 2015; 7.8 in ED;  -Hemoglobin 10.3 on the day of discharge - 30lb weight loss within past 6 months; intentional, yet still concerning  -appreciate Dr. Amedeo Plenty  Acute on Chronic Renal Failure (CKD stage 3):  - creatinine down to 1.81; trending downward with IVF  - anticipate pre-renal, hypovolemic cause  - continue IVF supplementation  -baseline creatinine 1.2-1.5  -The patient's serum creatinine peaked at 1.97 during the admission -Maxzide was discontinued -With PRBCs and fluid resuscitation, the patient's serum creatinine improved to 1.67 -The patient will remain off of Maxzide at the time of discharge Hypertension:  - controlled at home with amlodipine and Maxzide  - Maxzide on hold presently due to AKI  -BP remains stable  -The  patient will not restart Maxzide at the time of discharge. Her blood pressure remained stable off of Maxzide -She will continue amlodipine  Glaucoma:  - stable  - Continue Combigan and Travatan   Discharge Condition: Stable  Disposition:  Follow-up Information   Follow up with HAYES,JOHN C, MD In 2 weeks.   Specialty:  Gastroenterology   Contact information:   [address], [place] [address] [phone]     Discharge home  Diet: Heart healthy  Wt Readings from Last 3 Encounters:  12/02/13 93.622 kg (206 lb 6.4 oz)  12/02/13 93.622 kg (206 lb 6.4 oz)  12/02/13 93.622 kg (206 lb 6.4 oz)    History of present illness:  63 y.o. female with a Past Medical History of obesity, HTN, and glaucoma with recent intentional 30 pound weight loss who presents today with an episode of significant hematochezia on the morning of admission. States had one small episode on the day prior to admission; however, she had a large or bloody bowel movement on the day of admission. Baseline Hgb is 13, but recently dropped to 8 within past few months, and today is at 7. Has been seen by PCP on a regular basis, receiving consistent CBC workups and referral to GI. Evaluation by GI has been scheduled for 8/13. Denies NVD, melena, abdominal pain, rectal pain, lightheadedness, SOB, chest pain. Denies family hx of cancer.   The patient was admitted. Gastroenterology was consulted. The patient was transfused with 2 units PRBCs. Her hemoglobin remained stable after transfusion. Colonoscopy revealed a single diverticulum with internal hemorrhoids. For her acute on chronic renal failure, Maxzide  was discontinued. Her serum creatinine improved to 1.67. She will not restart Maxzide. She was instructed to followup with her primary care provider to check her blood pressure will ultimately make further decisions regarding her anti-hypertensive regimen.     Consultants: [facility]--Dr. Amedeo Plenty  Discharge  Exam: Filed Vitals:   12/05/13 1205  BP: 123/78  Pulse: 66  Temp:   Resp: 30   Filed Vitals:   12/05/13 1150 12/05/13 1155 12/05/13 1200 12/05/13 1205  BP: 157/101 126/76 132/77 123/78  Pulse: 70 70 78 66  Temp:      TempSrc:      Resp: 20 27 23 30   Height:      Weight:      SpO2: 98% 98% 96% 95%   General: A&O x 3, NAD, pleasant, cooperative Cardiovascular: RRR, no rub, no gallop, no S3 Respiratory: CTAB, no wheeze, no rhonchi Abdomen:soft, nontender, nondistended, positive bowel sounds Extremities: trace LE edema, No lymphangitis, no petechiae  Discharge Instructions      Discharge Instructions   Diet - low sodium heart healthy    Complete by:  As directed      Increase activity slowly    Complete by:  As directed             Medication List    STOP taking these medications       triamterene-hydrochlorothiazide 37.5-25 MG per tablet  Commonly known as:  MAXZIDE-25      TAKE these medications       amLODipine 10 MG tablet  Commonly known as:  NORVASC  Take 10 mg by mouth daily.     COMBIGAN 0.2-0.5 % ophthalmic solution  Generic drug:  brimonidine-timolol  Place 1 drop into both eyes every 12 (twelve) hours.     iron polysaccharides 150 MG capsule  Commonly known as:  NIFEREX  Take 1 capsule (150 mg total) by mouth daily.     travoprost (benzalkonium) 0.004 % ophthalmic solution  Commonly known as:  TRAVATAN  Place 1 drop into both eyes at bedtime.         The results of significant diagnostics from this hospitalization (including imaging, microbiology, ancillary and laboratory) are listed below for reference.    Significant Diagnostic Studies: Ct Abdomen Pelvis W Contrast  12/02/2013   CLINICAL DATA:  Rectal bleeding  EXAM: CT ABDOMEN AND PELVIS WITH CONTRAST  TECHNIQUE: Multidetector CT imaging of the abdomen and pelvis was performed using the standard protocol following bolus administration of intravenous contrast.  CONTRAST:  57mL OMNIPAQUE  IOHEXOL 300 MG/ML  SOLN  COMPARISON:  06/19/2004  FINDINGS: The liver, gallbladder, spleen, pancreas, adrenal glands, kidneys are within normal limits  No free-fluid.  No abnormal retroperitoneal adenopathy.  Bladder is markedly distended.  No obvious rectal mass.  Mild L3-4 and L4-5 facet arthropathy.  IMPRESSION: Bladder distention.  No acute pathology.   Electronically Signed   By: Maryclare Bean M.D.   On: 12/02/2013 10:45   Dg Chest Port 1 View  12/02/2013   CLINICAL DATA:  Rectal bleeding, hypertension, anemia  EXAM: PORTABLE CHEST - 1 VIEW  COMPARISON:  06/12/2009  FINDINGS: The heart size and mediastinal contours are within normal limits. Both lungs are clear. The visualized skeletal structures are unremarkable.  IMPRESSION: No active disease.   Electronically Signed   By: Daryll Brod M.D.   On: 12/02/2013 09:10     Microbiology: No results found for this or any previous visit (from the past 240 hour(s)).  Labs: Basic Metabolic Panel:  Recent Labs Lab 12/02/13 0801 12/03/13 0030 12/04/13 0032  NA 141 140 140  K 4.2 4.1 4.0  CL 104 101 102  CO2 22 24 24   GLUCOSE 120* 89 91  BUN 33* 28* 24*  CREATININE 1.97* 1.81* 1.67*  CALCIUM 9.9 9.7 9.7   Liver Function Tests:  Recent Labs Lab 12/02/13 0801  AST 19  ALT 12  ALKPHOS 105  BILITOT 0.2*  PROT 7.5  ALBUMIN 4.0    Recent Labs Lab 12/02/13 0801  LIPASE 69*   No results found for this basename: AMMONIA,  in the last 168 hours CBC:  Recent Labs Lab 12/02/13 0801  12/03/13 0030 12/03/13 0825 12/03/13 1539 12/04/13 0032 12/05/13 0638  WBC 5.9  < > 9.6 6.6 7.5 8.9 7.5  NEUTROABS 3.8  --   --   --   --   --   --   HGB 7.8*  < > 10.2* 10.5* 10.4* 9.8* 10.3*  HCT 26.5*  < > 32.7* 33.5* 33.6* 31.5* 33.9*  MCV 67.3*  < > 69.1* 69.5* 69.9* 69.5* 70.3*  PLT 324  < > 321 317 341 311 348  < > = values in this interval not displayed. Cardiac Enzymes: No results found for this basename: CKTOTAL, CKMB, CKMBINDEX,  TROPONINI,  in the last 168 hours BNP: No components found with this basename: POCBNP,  CBG: No results found for this basename: GLUCAP,  in the last 168 hours  Time coordinating discharge:  Greater than 30 minutes  Signed:  Leontyne Manville, DO [facility] Pager: [phone] 12/05/2013, 1:08 PM

## 2013-12-05 NOTE — Op Note (Signed)
[facility], [id number]   ENDOSCOPY PROCEDURE REPORT  PATIENT: [name], Payne  MR#: [id number] BIRTHDATE: 1950-06-08 , 49  yrs. old GENDER: Female  ENDOSCOPIST: Clarene Essex, MD REFERRED BY:  PROCEDURE DATE:  12/05/2013 PROCEDURE:   EGD w/ biopsy ASA CLASS:   Class II INDICATIONS:Anemia and Hematochezia.  based on review of office computer chart her anemia is microcytic and unchanged since May and nondiagnostic colonoscopy yesterday  MEDICATIONS: Fentanyl 50 mcg IV and Versed 7 mg IV  TOPICAL ANESTHETIC:used  DESCRIPTION OF PROCEDURE:   After the risks benefits and alternatives of the procedure were thoroughly explained, informed consent was obtained.  The PENTAX GASTOROSCOPE [id number]  endoscope was introduced through the mouth and advanced to the second portion of the duodenum , limited by Without limitations.   The instrument was slowly withdrawn as the mucosa was fully examined.the findings are recorded below there was no signs of active bleeding and the patient tolerated the procedure well there was no obvious immediate complication         FINDINGS:1 small hiatal hernia 2. 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 Otherwise within normal limits EGD without any signs of active bleeding until biopsies taken  [place] [facility] and GI followup in a few weeks to recheck CBC and guaiacs and make sure no further workup and plans as needed and she might need hematologic consult at some point but okay with me to go home if eating well and no residual obvious procedural problem   REPEAT EXAM: as needed particularly pending polyp biopsy   _______________________________ Clarene Essex, MD eSigned:   Clarene Essex, MD 12/05/2013 12:01 PM    CC:  PATIENT NAME:  Jamie, Payne MR#: [id number]

## 2013-12-07 ENCOUNTER — Encounter (HOSPITAL_COMMUNITY): Payer: Self-pay | Admitting: Gastroenterology

## 2014-06-16 ENCOUNTER — Encounter (INDEPENDENT_AMBULATORY_CARE_PROVIDER_SITE_OTHER): Payer: Commercial Managed Care - HMO | Admitting: Ophthalmology

## 2014-06-16 ENCOUNTER — Encounter (INDEPENDENT_AMBULATORY_CARE_PROVIDER_SITE_OTHER): Payer: Self-pay | Admitting: Ophthalmology

## 2014-06-16 DIAGNOSIS — I10 Essential (primary) hypertension: Secondary | ICD-10-CM | POA: Diagnosis not present

## 2014-06-16 DIAGNOSIS — H35372 Puckering of macula, left eye: Secondary | ICD-10-CM

## 2014-06-16 DIAGNOSIS — H5213 Myopia, bilateral: Secondary | ICD-10-CM | POA: Diagnosis not present

## 2014-06-16 DIAGNOSIS — H18413 Arcus senilis, bilateral: Secondary | ICD-10-CM | POA: Diagnosis not present

## 2014-06-16 DIAGNOSIS — H33022 Retinal detachment with multiple breaks, left eye: Secondary | ICD-10-CM

## 2014-06-16 DIAGNOSIS — H33312 Horseshoe tear of retina without detachment, left eye: Secondary | ICD-10-CM | POA: Diagnosis not present

## 2014-06-16 DIAGNOSIS — H3531 Nonexudative age-related macular degeneration: Secondary | ICD-10-CM

## 2014-06-16 DIAGNOSIS — H35033 Hypertensive retinopathy, bilateral: Secondary | ICD-10-CM

## 2014-06-16 DIAGNOSIS — H25013 Cortical age-related cataract, bilateral: Secondary | ICD-10-CM | POA: Diagnosis not present

## 2014-06-16 DIAGNOSIS — H4011X3 Primary open-angle glaucoma, severe stage: Secondary | ICD-10-CM | POA: Diagnosis not present

## 2014-06-16 DIAGNOSIS — H2513 Age-related nuclear cataract, bilateral: Secondary | ICD-10-CM | POA: Diagnosis not present

## 2014-06-16 DIAGNOSIS — H43813 Vitreous degeneration, bilateral: Secondary | ICD-10-CM | POA: Diagnosis not present

## 2014-06-16 DIAGNOSIS — H52223 Regular astigmatism, bilateral: Secondary | ICD-10-CM | POA: Diagnosis not present

## 2014-06-24 ENCOUNTER — Ambulatory Visit (INDEPENDENT_AMBULATORY_CARE_PROVIDER_SITE_OTHER): Payer: Commercial Managed Care - HMO | Admitting: Ophthalmology

## 2014-06-24 DIAGNOSIS — H33302 Unspecified retinal break, left eye: Secondary | ICD-10-CM

## 2014-10-06 DIAGNOSIS — B079 Viral wart, unspecified: Secondary | ICD-10-CM | POA: Diagnosis not present

## 2014-10-25 ENCOUNTER — Ambulatory Visit (INDEPENDENT_AMBULATORY_CARE_PROVIDER_SITE_OTHER): Payer: Commercial Managed Care - HMO | Admitting: Ophthalmology

## 2015-01-18 DIAGNOSIS — I10 Essential (primary) hypertension: Secondary | ICD-10-CM | POA: Diagnosis not present

## 2015-01-18 DIAGNOSIS — M25571 Pain in right ankle and joints of right foot: Secondary | ICD-10-CM | POA: Diagnosis not present

## 2015-01-18 DIAGNOSIS — Z23 Encounter for immunization: Secondary | ICD-10-CM | POA: Diagnosis not present

## 2015-01-18 DIAGNOSIS — D649 Anemia, unspecified: Secondary | ICD-10-CM | POA: Diagnosis not present

## 2015-01-18 DIAGNOSIS — E782 Mixed hyperlipidemia: Secondary | ICD-10-CM | POA: Diagnosis not present

## 2015-01-18 DIAGNOSIS — Z1389 Encounter for screening for other disorder: Secondary | ICD-10-CM | POA: Diagnosis not present

## 2015-01-18 DIAGNOSIS — N183 Chronic kidney disease, stage 3 (moderate): Secondary | ICD-10-CM | POA: Diagnosis not present

## 2015-02-01 ENCOUNTER — Other Ambulatory Visit: Payer: Self-pay | Admitting: Podiatry

## 2015-02-01 ENCOUNTER — Ambulatory Visit (INDEPENDENT_AMBULATORY_CARE_PROVIDER_SITE_OTHER): Payer: Commercial Managed Care - HMO

## 2015-02-01 ENCOUNTER — Ambulatory Visit (INDEPENDENT_AMBULATORY_CARE_PROVIDER_SITE_OTHER): Payer: Commercial Managed Care - HMO | Admitting: Podiatry

## 2015-02-01 ENCOUNTER — Encounter: Payer: Self-pay | Admitting: Podiatry

## 2015-02-01 VITALS — BP 159/102 | HR 87 | Resp 16 | Ht 64.5 in | Wt 215.0 lb

## 2015-02-01 DIAGNOSIS — M2012 Hallux valgus (acquired), left foot: Secondary | ICD-10-CM

## 2015-02-01 DIAGNOSIS — M79671 Pain in right foot: Secondary | ICD-10-CM

## 2015-02-01 DIAGNOSIS — M722 Plantar fascial fibromatosis: Secondary | ICD-10-CM

## 2015-02-01 DIAGNOSIS — M7730 Calcaneal spur, unspecified foot: Secondary | ICD-10-CM | POA: Diagnosis not present

## 2015-02-01 DIAGNOSIS — M7661 Achilles tendinitis, right leg: Secondary | ICD-10-CM | POA: Diagnosis not present

## 2015-02-01 DIAGNOSIS — M795 Residual foreign body in soft tissue: Secondary | ICD-10-CM

## 2015-02-01 MED ORDER — MELOXICAM 15 MG PO TABS: 15.0000 mg | ORAL_TABLET | Freq: Every day | ORAL | Status: DC

## 2015-02-01 MED ORDER — METHYLPREDNISOLONE 4 MG PO TBPK: ORAL_TABLET | ORAL | Status: DC

## 2015-02-01 NOTE — Progress Notes (Signed)
   Subjective:    Patient ID: Jamie Payne, female    DOB: Dec 21, 1950, 64 y.o.   MRN: [id number]  HPI: She presents as a 64 year old female chief complaint of pain to the posterior aspect of the right heel which has been on and off for several years. She is also having pain to the plantar aspect of the right heel. She is also concerned about her bunions which have worsened over the years and has started to prevent her from performing her daily activities. She states that they hurt her when she walks in the painful with shoe gear. She's done nothing to treat the Achilles the plantar fascia or the bunions.    Review of Systems  All other systems reviewed and are negative.      Objective:   Physical Exam: 64 year old black female in no apparent distress vital signs stable alert and oriented 3. Pulses are strong and palpable bilateral capillary fill time is immediate. Neurologic sensorium is intact per Semmes-Weinstein monofilament. Deep tendon reflexes are intact bilaterally muscle strength +5 over 5 dorsiflexors plantar flexors and inverters and everters all intrinsic musculature is intact. Orthopedic evaluation and x-rays all joints distal to the ankle level range of motion without crepitation. Cutaneous evaluation demonstrates supple well-hydrated cutis. She has moderate to severe hallux abductovalgus deformities bilateral. She has pain on range of motion of the first metatarsophalangeal joints bilateral. She has pain on palpation of the prominent medial condyle of the first metatarsal bilateral. She also has pain on palpation to the medial calcaneal tubercle of the right heel as well as to the posterior aspect of the right calcaneus where a Haglund's deformity is present. 3 views bilateral foot demonstrates severe hallux abductovalgus deformity bilaterally with an increase in the first intermetatarsal angle greater than 15. Very large vertical calcaneal spurs noted bilaterally with soft tissue  increase in density of both Achilles. She also has soft tissue increase in density at the plantar fascial calcaneal insertion site of the right heel. This is consistent with plantar fasciitis. Radiographs of the right foot also demonstrate a foreign body beneath the third metatarsophalangeal joint which appears to be a sewing pin or needle.        Assessment & Plan:  Assessment retro-calcaneal/insertional Achilles tendinitis right. Plantar fasciitis right. Hallux abductovalgus deformities bilateral. Foreign body right foot.  Plan: Discussed etiology pathology conservative versus surgical therapies. Started her on a Medrol Dosepak to be followed by meloxicam. Placed her in a plantar fascial brace as well as a night splint. Discussed appropriate shoe gear stretching exercises ice therapy and shoe modifications. I injected the plantar fascia today with Kenalog and local aesthetic injected superficial to the Achilles tendon with 2 mg of dexamethasone to the right heel to help reduce her symptoms. We discussed surgical intervention regarding the first metatarsophalangeal joint. We discussed the need to remove the foreign body should surgery be performed on this right foot. I will follow up with her proximally one month.

## 2015-02-01 NOTE — Patient Instructions (Addendum)

## 2015-03-03 ENCOUNTER — Ambulatory Visit: Payer: Commercial Managed Care - HMO | Admitting: Podiatry

## 2015-04-12 ENCOUNTER — Ambulatory Visit: Payer: Commercial Managed Care - HMO | Admitting: Podiatry

## 2015-04-14 ENCOUNTER — Ambulatory Visit: Payer: Commercial Managed Care - HMO | Admitting: Podiatry

## 2015-05-26 DIAGNOSIS — H52223 Regular astigmatism, bilateral: Secondary | ICD-10-CM | POA: Diagnosis not present

## 2015-05-26 DIAGNOSIS — H18413 Arcus senilis, bilateral: Secondary | ICD-10-CM | POA: Diagnosis not present

## 2015-05-26 DIAGNOSIS — H2513 Age-related nuclear cataract, bilateral: Secondary | ICD-10-CM | POA: Diagnosis not present

## 2015-05-26 DIAGNOSIS — H04123 Dry eye syndrome of bilateral lacrimal glands: Secondary | ICD-10-CM | POA: Diagnosis not present

## 2015-05-26 DIAGNOSIS — H11423 Conjunctival edema, bilateral: Secondary | ICD-10-CM | POA: Diagnosis not present

## 2015-05-26 DIAGNOSIS — H25013 Cortical age-related cataract, bilateral: Secondary | ICD-10-CM | POA: Diagnosis not present

## 2015-05-26 DIAGNOSIS — H11153 Pinguecula, bilateral: Secondary | ICD-10-CM | POA: Diagnosis not present

## 2015-05-26 DIAGNOSIS — H5213 Myopia, bilateral: Secondary | ICD-10-CM | POA: Diagnosis not present

## 2015-05-26 DIAGNOSIS — H401133 Primary open-angle glaucoma, bilateral, severe stage: Secondary | ICD-10-CM | POA: Diagnosis not present

## 2015-05-28 DIAGNOSIS — H11423 Conjunctival edema, bilateral: Secondary | ICD-10-CM | POA: Diagnosis not present

## 2015-05-28 DIAGNOSIS — H18413 Arcus senilis, bilateral: Secondary | ICD-10-CM | POA: Diagnosis not present

## 2015-05-28 DIAGNOSIS — H2513 Age-related nuclear cataract, bilateral: Secondary | ICD-10-CM | POA: Diagnosis not present

## 2015-05-28 DIAGNOSIS — H04123 Dry eye syndrome of bilateral lacrimal glands: Secondary | ICD-10-CM | POA: Diagnosis not present

## 2015-05-28 DIAGNOSIS — H5213 Myopia, bilateral: Secondary | ICD-10-CM | POA: Diagnosis not present

## 2015-05-28 DIAGNOSIS — H52223 Regular astigmatism, bilateral: Secondary | ICD-10-CM | POA: Diagnosis not present

## 2015-05-28 DIAGNOSIS — H401133 Primary open-angle glaucoma, bilateral, severe stage: Secondary | ICD-10-CM | POA: Diagnosis not present

## 2015-05-28 DIAGNOSIS — H11153 Pinguecula, bilateral: Secondary | ICD-10-CM | POA: Diagnosis not present

## 2015-05-28 DIAGNOSIS — H25013 Cortical age-related cataract, bilateral: Secondary | ICD-10-CM | POA: Diagnosis not present

## 2015-06-09 DIAGNOSIS — H2513 Age-related nuclear cataract, bilateral: Secondary | ICD-10-CM | POA: Diagnosis not present

## 2015-06-09 DIAGNOSIS — H25013 Cortical age-related cataract, bilateral: Secondary | ICD-10-CM | POA: Diagnosis not present

## 2015-06-09 DIAGNOSIS — H11423 Conjunctival edema, bilateral: Secondary | ICD-10-CM | POA: Diagnosis not present

## 2015-06-09 DIAGNOSIS — H18413 Arcus senilis, bilateral: Secondary | ICD-10-CM | POA: Diagnosis not present

## 2015-06-09 DIAGNOSIS — H11153 Pinguecula, bilateral: Secondary | ICD-10-CM | POA: Diagnosis not present

## 2015-06-09 DIAGNOSIS — H401133 Primary open-angle glaucoma, bilateral, severe stage: Secondary | ICD-10-CM | POA: Diagnosis not present

## 2015-06-27 DIAGNOSIS — H401133 Primary open-angle glaucoma, bilateral, severe stage: Secondary | ICD-10-CM | POA: Diagnosis not present

## 2015-06-27 DIAGNOSIS — H2513 Age-related nuclear cataract, bilateral: Secondary | ICD-10-CM | POA: Diagnosis not present

## 2015-08-04 DIAGNOSIS — H401113 Primary open-angle glaucoma, right eye, severe stage: Secondary | ICD-10-CM | POA: Diagnosis not present

## 2015-08-23 DIAGNOSIS — D649 Anemia, unspecified: Secondary | ICD-10-CM | POA: Diagnosis not present

## 2015-08-23 DIAGNOSIS — I10 Essential (primary) hypertension: Secondary | ICD-10-CM | POA: Diagnosis not present

## 2015-08-23 DIAGNOSIS — H409 Unspecified glaucoma: Secondary | ICD-10-CM | POA: Diagnosis not present

## 2015-08-23 DIAGNOSIS — E782 Mixed hyperlipidemia: Secondary | ICD-10-CM | POA: Diagnosis not present

## 2015-08-23 DIAGNOSIS — M25571 Pain in right ankle and joints of right foot: Secondary | ICD-10-CM | POA: Diagnosis not present

## 2015-08-23 DIAGNOSIS — N183 Chronic kidney disease, stage 3 (moderate): Secondary | ICD-10-CM | POA: Diagnosis not present

## 2015-08-23 DIAGNOSIS — Z0000 Encounter for general adult medical examination without abnormal findings: Secondary | ICD-10-CM | POA: Diagnosis not present

## 2015-08-23 DIAGNOSIS — R109 Unspecified abdominal pain: Secondary | ICD-10-CM | POA: Diagnosis not present

## 2015-08-23 DIAGNOSIS — R0602 Shortness of breath: Secondary | ICD-10-CM | POA: Diagnosis not present

## 2015-11-03 DIAGNOSIS — H401123 Primary open-angle glaucoma, left eye, severe stage: Secondary | ICD-10-CM | POA: Diagnosis not present

## 2016-02-17 DIAGNOSIS — H11153 Pinguecula, bilateral: Secondary | ICD-10-CM | POA: Diagnosis not present

## 2016-02-17 DIAGNOSIS — H401122 Primary open-angle glaucoma, left eye, moderate stage: Secondary | ICD-10-CM | POA: Diagnosis not present

## 2016-02-17 DIAGNOSIS — H11423 Conjunctival edema, bilateral: Secondary | ICD-10-CM | POA: Diagnosis not present

## 2016-02-17 DIAGNOSIS — H35033 Hypertensive retinopathy, bilateral: Secondary | ICD-10-CM | POA: Diagnosis not present

## 2016-02-17 DIAGNOSIS — H04123 Dry eye syndrome of bilateral lacrimal glands: Secondary | ICD-10-CM | POA: Diagnosis not present

## 2016-02-17 DIAGNOSIS — H2513 Age-related nuclear cataract, bilateral: Secondary | ICD-10-CM | POA: Diagnosis not present

## 2016-02-17 DIAGNOSIS — H18413 Arcus senilis, bilateral: Secondary | ICD-10-CM | POA: Diagnosis not present

## 2016-02-17 DIAGNOSIS — H25043 Posterior subcapsular polar age-related cataract, bilateral: Secondary | ICD-10-CM | POA: Diagnosis not present

## 2016-02-17 DIAGNOSIS — H401113 Primary open-angle glaucoma, right eye, severe stage: Secondary | ICD-10-CM | POA: Diagnosis not present

## 2016-04-12 DIAGNOSIS — D649 Anemia, unspecified: Secondary | ICD-10-CM | POA: Diagnosis not present

## 2016-04-12 DIAGNOSIS — E782 Mixed hyperlipidemia: Secondary | ICD-10-CM | POA: Diagnosis not present

## 2016-04-12 DIAGNOSIS — I10 Essential (primary) hypertension: Secondary | ICD-10-CM | POA: Diagnosis not present

## 2016-04-12 DIAGNOSIS — M79673 Pain in unspecified foot: Secondary | ICD-10-CM | POA: Diagnosis not present

## 2016-04-12 DIAGNOSIS — N183 Chronic kidney disease, stage 3 (moderate): Secondary | ICD-10-CM | POA: Diagnosis not present

## 2016-04-12 DIAGNOSIS — F439 Reaction to severe stress, unspecified: Secondary | ICD-10-CM | POA: Diagnosis not present

## 2016-06-01 DIAGNOSIS — H401113 Primary open-angle glaucoma, right eye, severe stage: Secondary | ICD-10-CM | POA: Diagnosis not present

## 2016-06-01 DIAGNOSIS — H401122 Primary open-angle glaucoma, left eye, moderate stage: Secondary | ICD-10-CM | POA: Diagnosis not present

## 2016-06-01 DIAGNOSIS — H2513 Age-related nuclear cataract, bilateral: Secondary | ICD-10-CM | POA: Diagnosis not present

## 2016-06-01 DIAGNOSIS — H04123 Dry eye syndrome of bilateral lacrimal glands: Secondary | ICD-10-CM | POA: Diagnosis not present

## 2016-06-01 DIAGNOSIS — H11153 Pinguecula, bilateral: Secondary | ICD-10-CM | POA: Diagnosis not present

## 2016-06-01 DIAGNOSIS — H25013 Cortical age-related cataract, bilateral: Secondary | ICD-10-CM | POA: Diagnosis not present

## 2016-06-01 DIAGNOSIS — H18413 Arcus senilis, bilateral: Secondary | ICD-10-CM | POA: Diagnosis not present

## 2016-07-06 DIAGNOSIS — K317 Polyp of stomach and duodenum: Secondary | ICD-10-CM | POA: Diagnosis not present

## 2016-07-18 DIAGNOSIS — K317 Polyp of stomach and duodenum: Secondary | ICD-10-CM | POA: Diagnosis not present

## 2016-07-18 DIAGNOSIS — K449 Diaphragmatic hernia without obstruction or gangrene: Secondary | ICD-10-CM | POA: Diagnosis not present

## 2016-07-24 DIAGNOSIS — K317 Polyp of stomach and duodenum: Secondary | ICD-10-CM | POA: Diagnosis not present

## 2016-08-10 DIAGNOSIS — H11441 Conjunctival cysts, right eye: Secondary | ICD-10-CM | POA: Diagnosis not present

## 2016-08-10 DIAGNOSIS — H401133 Primary open-angle glaucoma, bilateral, severe stage: Secondary | ICD-10-CM | POA: Diagnosis not present

## 2016-08-10 DIAGNOSIS — H2513 Age-related nuclear cataract, bilateral: Secondary | ICD-10-CM | POA: Diagnosis not present

## 2016-08-31 DIAGNOSIS — H18413 Arcus senilis, bilateral: Secondary | ICD-10-CM | POA: Diagnosis not present

## 2016-08-31 DIAGNOSIS — H11423 Conjunctival edema, bilateral: Secondary | ICD-10-CM | POA: Diagnosis not present

## 2016-08-31 DIAGNOSIS — H401122 Primary open-angle glaucoma, left eye, moderate stage: Secondary | ICD-10-CM | POA: Diagnosis not present

## 2016-08-31 DIAGNOSIS — H11153 Pinguecula, bilateral: Secondary | ICD-10-CM | POA: Diagnosis not present

## 2016-08-31 DIAGNOSIS — H401113 Primary open-angle glaucoma, right eye, severe stage: Secondary | ICD-10-CM | POA: Diagnosis not present

## 2016-08-31 DIAGNOSIS — H2513 Age-related nuclear cataract, bilateral: Secondary | ICD-10-CM | POA: Diagnosis not present

## 2016-08-31 DIAGNOSIS — H47233 Glaucomatous optic atrophy, bilateral: Secondary | ICD-10-CM | POA: Diagnosis not present

## 2016-08-31 DIAGNOSIS — H25013 Cortical age-related cataract, bilateral: Secondary | ICD-10-CM | POA: Diagnosis not present

## 2016-08-31 DIAGNOSIS — H15831 Staphyloma posticum, right eye: Secondary | ICD-10-CM | POA: Diagnosis not present

## 2016-09-13 DIAGNOSIS — K317 Polyp of stomach and duodenum: Secondary | ICD-10-CM | POA: Diagnosis not present

## 2016-09-19 DIAGNOSIS — K317 Polyp of stomach and duodenum: Secondary | ICD-10-CM | POA: Diagnosis not present

## 2016-09-21 DIAGNOSIS — H401133 Primary open-angle glaucoma, bilateral, severe stage: Secondary | ICD-10-CM | POA: Diagnosis not present

## 2016-09-21 DIAGNOSIS — H2513 Age-related nuclear cataract, bilateral: Secondary | ICD-10-CM | POA: Diagnosis not present

## 2016-10-19 DIAGNOSIS — K317 Polyp of stomach and duodenum: Secondary | ICD-10-CM | POA: Diagnosis not present

## 2016-10-19 DIAGNOSIS — K5901 Slow transit constipation: Secondary | ICD-10-CM | POA: Diagnosis not present

## 2016-11-08 DIAGNOSIS — H2511 Age-related nuclear cataract, right eye: Secondary | ICD-10-CM | POA: Diagnosis not present

## 2016-11-29 DIAGNOSIS — Z13820 Encounter for screening for osteoporosis: Secondary | ICD-10-CM | POA: Diagnosis not present

## 2016-11-29 DIAGNOSIS — Z0000 Encounter for general adult medical examination without abnormal findings: Secondary | ICD-10-CM | POA: Diagnosis not present

## 2016-11-29 DIAGNOSIS — D649 Anemia, unspecified: Secondary | ICD-10-CM | POA: Diagnosis not present

## 2016-11-29 DIAGNOSIS — Z1389 Encounter for screening for other disorder: Secondary | ICD-10-CM | POA: Diagnosis not present

## 2016-11-29 DIAGNOSIS — N183 Chronic kidney disease, stage 3 (moderate): Secondary | ICD-10-CM | POA: Diagnosis not present

## 2016-11-29 DIAGNOSIS — Z1159 Encounter for screening for other viral diseases: Secondary | ICD-10-CM | POA: Diagnosis not present

## 2016-11-29 DIAGNOSIS — Z1231 Encounter for screening mammogram for malignant neoplasm of breast: Secondary | ICD-10-CM | POA: Diagnosis not present

## 2016-11-29 DIAGNOSIS — E782 Mixed hyperlipidemia: Secondary | ICD-10-CM | POA: Diagnosis not present

## 2016-11-29 DIAGNOSIS — I10 Essential (primary) hypertension: Secondary | ICD-10-CM | POA: Diagnosis not present

## 2016-11-29 DIAGNOSIS — Z1211 Encounter for screening for malignant neoplasm of colon: Secondary | ICD-10-CM | POA: Diagnosis not present

## 2016-12-04 ENCOUNTER — Other Ambulatory Visit: Payer: Self-pay | Admitting: Family Medicine

## 2016-12-04 DIAGNOSIS — E2839 Other primary ovarian failure: Secondary | ICD-10-CM

## 2016-12-05 ENCOUNTER — Other Ambulatory Visit: Payer: Self-pay | Admitting: Family Medicine

## 2016-12-05 DIAGNOSIS — Z1231 Encounter for screening mammogram for malignant neoplasm of breast: Secondary | ICD-10-CM

## 2016-12-14 ENCOUNTER — Ambulatory Visit
Admission: RE | Admit: 2016-12-14 | Discharge: 2016-12-14 | Disposition: A | Discharge status: Home / Self Care | Payer: BLUE CROSS/BLUE SHIELD | Source: Ambulatory Visit | Attending: Family Medicine | Admitting: Family Medicine

## 2016-12-14 DIAGNOSIS — Z1231 Encounter for screening mammogram for malignant neoplasm of breast: Secondary | ICD-10-CM | POA: Diagnosis not present

## 2016-12-14 DIAGNOSIS — E2839 Other primary ovarian failure: Secondary | ICD-10-CM

## 2016-12-14 DIAGNOSIS — Z13820 Encounter for screening for osteoporosis: Secondary | ICD-10-CM | POA: Diagnosis not present

## 2016-12-14 DIAGNOSIS — K5901 Slow transit constipation: Secondary | ICD-10-CM | POA: Diagnosis not present

## 2016-12-18 ENCOUNTER — Other Ambulatory Visit: Payer: Self-pay | Admitting: Family Medicine

## 2016-12-18 DIAGNOSIS — R928 Other abnormal and inconclusive findings on diagnostic imaging of breast: Secondary | ICD-10-CM

## 2016-12-21 DIAGNOSIS — H2512 Age-related nuclear cataract, left eye: Secondary | ICD-10-CM | POA: Diagnosis not present

## 2016-12-27 DIAGNOSIS — H2512 Age-related nuclear cataract, left eye: Secondary | ICD-10-CM | POA: Diagnosis not present

## 2016-12-28 ENCOUNTER — Other Ambulatory Visit: Payer: Self-pay | Admitting: Family Medicine

## 2016-12-28 ENCOUNTER — Ambulatory Visit
Admission: RE | Admit: 2016-12-28 | Discharge: 2016-12-28 | Disposition: A | Discharge status: Home / Self Care | Payer: BLUE CROSS/BLUE SHIELD | Source: Ambulatory Visit | Attending: Family Medicine | Admitting: Family Medicine

## 2016-12-28 DIAGNOSIS — N6313 Unspecified lump in the right breast, lower outer quadrant: Secondary | ICD-10-CM | POA: Diagnosis not present

## 2016-12-28 DIAGNOSIS — R928 Other abnormal and inconclusive findings on diagnostic imaging of breast: Secondary | ICD-10-CM

## 2016-12-28 DIAGNOSIS — N6320 Unspecified lump in the left breast, unspecified quadrant: Secondary | ICD-10-CM

## 2016-12-28 DIAGNOSIS — N6310 Unspecified lump in the right breast, unspecified quadrant: Secondary | ICD-10-CM

## 2016-12-28 DIAGNOSIS — N6324 Unspecified lump in the left breast, lower inner quadrant: Secondary | ICD-10-CM | POA: Diagnosis not present

## 2016-12-28 DIAGNOSIS — N6314 Unspecified lump in the right breast, lower inner quadrant: Secondary | ICD-10-CM | POA: Diagnosis not present

## 2016-12-28 DIAGNOSIS — N6323 Unspecified lump in the left breast, lower outer quadrant: Secondary | ICD-10-CM | POA: Diagnosis not present

## 2017-01-01 ENCOUNTER — Other Ambulatory Visit: Payer: Self-pay | Admitting: Family Medicine

## 2017-01-01 DIAGNOSIS — N6320 Unspecified lump in the left breast, unspecified quadrant: Secondary | ICD-10-CM

## 2017-01-02 ENCOUNTER — Ambulatory Visit
Admission: RE | Admit: 2017-01-02 | Discharge: 2017-01-02 | Disposition: A | Discharge status: Home / Self Care | Payer: BLUE CROSS/BLUE SHIELD | Source: Ambulatory Visit | Attending: Family Medicine | Admitting: Family Medicine

## 2017-01-02 DIAGNOSIS — N6012 Diffuse cystic mastopathy of left breast: Secondary | ICD-10-CM | POA: Diagnosis not present

## 2017-01-02 DIAGNOSIS — N6320 Unspecified lump in the left breast, unspecified quadrant: Secondary | ICD-10-CM

## 2017-01-02 DIAGNOSIS — N6324 Unspecified lump in the left breast, lower inner quadrant: Secondary | ICD-10-CM | POA: Diagnosis not present

## 2017-01-02 HISTORY — PX: BREAST BIOPSY: SHX20

## 2017-01-03 DIAGNOSIS — H2512 Age-related nuclear cataract, left eye: Secondary | ICD-10-CM | POA: Diagnosis not present

## 2017-01-23 ENCOUNTER — Other Ambulatory Visit: Payer: Self-pay | Admitting: Gastroenterology

## 2017-01-23 DIAGNOSIS — R109 Unspecified abdominal pain: Secondary | ICD-10-CM | POA: Diagnosis not present

## 2017-01-23 DIAGNOSIS — R1114 Bilious vomiting: Secondary | ICD-10-CM | POA: Diagnosis not present

## 2017-01-23 DIAGNOSIS — R1011 Right upper quadrant pain: Secondary | ICD-10-CM

## 2017-01-29 ENCOUNTER — Other Ambulatory Visit: Payer: BLUE CROSS/BLUE SHIELD

## 2017-01-30 ENCOUNTER — Ambulatory Visit
Admission: RE | Admit: 2017-01-30 | Discharge: 2017-01-30 | Disposition: A | Discharge status: Home / Self Care | Payer: BLUE CROSS/BLUE SHIELD | Source: Ambulatory Visit | Attending: Gastroenterology | Admitting: Gastroenterology

## 2017-01-30 DIAGNOSIS — R1011 Right upper quadrant pain: Secondary | ICD-10-CM

## 2017-02-07 DIAGNOSIS — G43A0 Cyclical vomiting, not intractable: Secondary | ICD-10-CM | POA: Diagnosis not present

## 2017-02-07 DIAGNOSIS — K760 Fatty (change of) liver, not elsewhere classified: Secondary | ICD-10-CM | POA: Diagnosis not present

## 2017-03-06 DIAGNOSIS — L700 Acne vulgaris: Secondary | ICD-10-CM | POA: Diagnosis not present

## 2017-03-06 DIAGNOSIS — B078 Other viral warts: Secondary | ICD-10-CM | POA: Diagnosis not present

## 2017-05-31 DIAGNOSIS — R112 Nausea with vomiting, unspecified: Secondary | ICD-10-CM | POA: Diagnosis not present

## 2017-05-31 DIAGNOSIS — K317 Polyp of stomach and duodenum: Secondary | ICD-10-CM | POA: Diagnosis not present

## 2017-05-31 DIAGNOSIS — B9681 Helicobacter pylori [H. pylori] as the cause of diseases classified elsewhere: Secondary | ICD-10-CM | POA: Diagnosis not present

## 2017-05-31 DIAGNOSIS — R1013 Epigastric pain: Secondary | ICD-10-CM | POA: Diagnosis not present

## 2017-06-03 DIAGNOSIS — D649 Anemia, unspecified: Secondary | ICD-10-CM | POA: Diagnosis not present

## 2017-06-03 DIAGNOSIS — E782 Mixed hyperlipidemia: Secondary | ICD-10-CM | POA: Diagnosis not present

## 2017-06-03 DIAGNOSIS — I10 Essential (primary) hypertension: Secondary | ICD-10-CM | POA: Diagnosis not present

## 2017-06-03 DIAGNOSIS — N183 Chronic kidney disease, stage 3 (moderate): Secondary | ICD-10-CM | POA: Diagnosis not present

## 2017-06-05 DIAGNOSIS — K317 Polyp of stomach and duodenum: Secondary | ICD-10-CM | POA: Diagnosis not present

## 2017-06-07 DIAGNOSIS — H401113 Primary open-angle glaucoma, right eye, severe stage: Secondary | ICD-10-CM | POA: Diagnosis not present

## 2017-06-07 DIAGNOSIS — Z961 Presence of intraocular lens: Secondary | ICD-10-CM | POA: Diagnosis not present

## 2017-06-07 DIAGNOSIS — H18413 Arcus senilis, bilateral: Secondary | ICD-10-CM | POA: Diagnosis not present

## 2017-06-07 DIAGNOSIS — H11153 Pinguecula, bilateral: Secondary | ICD-10-CM | POA: Diagnosis not present

## 2017-06-07 DIAGNOSIS — H401122 Primary open-angle glaucoma, left eye, moderate stage: Secondary | ICD-10-CM | POA: Diagnosis not present

## 2017-06-07 DIAGNOSIS — H04123 Dry eye syndrome of bilateral lacrimal glands: Secondary | ICD-10-CM | POA: Diagnosis not present

## 2017-06-07 DIAGNOSIS — H47233 Glaucomatous optic atrophy, bilateral: Secondary | ICD-10-CM | POA: Diagnosis not present

## 2017-06-07 DIAGNOSIS — Z9849 Cataract extraction status, unspecified eye: Secondary | ICD-10-CM | POA: Diagnosis not present

## 2017-06-07 DIAGNOSIS — H11423 Conjunctival edema, bilateral: Secondary | ICD-10-CM | POA: Diagnosis not present

## 2017-06-17 DIAGNOSIS — Z9849 Cataract extraction status, unspecified eye: Secondary | ICD-10-CM | POA: Diagnosis not present

## 2017-06-17 DIAGNOSIS — Z961 Presence of intraocular lens: Secondary | ICD-10-CM | POA: Diagnosis not present

## 2017-06-17 DIAGNOSIS — H47233 Glaucomatous optic atrophy, bilateral: Secondary | ICD-10-CM | POA: Diagnosis not present

## 2017-06-17 DIAGNOSIS — H04123 Dry eye syndrome of bilateral lacrimal glands: Secondary | ICD-10-CM | POA: Diagnosis not present

## 2017-06-17 DIAGNOSIS — H401113 Primary open-angle glaucoma, right eye, severe stage: Secondary | ICD-10-CM | POA: Diagnosis not present

## 2017-06-17 DIAGNOSIS — H18413 Arcus senilis, bilateral: Secondary | ICD-10-CM | POA: Diagnosis not present

## 2017-06-17 DIAGNOSIS — H401122 Primary open-angle glaucoma, left eye, moderate stage: Secondary | ICD-10-CM | POA: Diagnosis not present

## 2017-06-17 DIAGNOSIS — H11423 Conjunctival edema, bilateral: Secondary | ICD-10-CM | POA: Diagnosis not present

## 2017-06-17 DIAGNOSIS — H11153 Pinguecula, bilateral: Secondary | ICD-10-CM | POA: Diagnosis not present

## 2017-06-19 DIAGNOSIS — J069 Acute upper respiratory infection, unspecified: Secondary | ICD-10-CM | POA: Diagnosis not present

## 2017-07-01 ENCOUNTER — Ambulatory Visit
Admission: RE | Admit: 2017-07-01 | Discharge: 2017-07-01 | Disposition: A | Discharge status: Home / Self Care | Payer: BLUE CROSS/BLUE SHIELD | Source: Ambulatory Visit

## 2017-07-01 ENCOUNTER — Ambulatory Visit
Admission: RE | Admit: 2017-07-01 | Discharge: 2017-07-01 | Disposition: A | Discharge status: Home / Self Care | Payer: BLUE CROSS/BLUE SHIELD | Source: Ambulatory Visit | Attending: Family Medicine | Admitting: Family Medicine

## 2017-07-01 ENCOUNTER — Other Ambulatory Visit: Payer: BLUE CROSS/BLUE SHIELD

## 2017-07-01 ENCOUNTER — Other Ambulatory Visit: Payer: Self-pay | Admitting: Family Medicine

## 2017-07-01 DIAGNOSIS — N6310 Unspecified lump in the right breast, unspecified quadrant: Secondary | ICD-10-CM | POA: Diagnosis not present

## 2017-07-01 DIAGNOSIS — N6320 Unspecified lump in the left breast, unspecified quadrant: Secondary | ICD-10-CM

## 2017-07-01 DIAGNOSIS — R928 Other abnormal and inconclusive findings on diagnostic imaging of breast: Secondary | ICD-10-CM | POA: Diagnosis not present

## 2017-09-04 DIAGNOSIS — K625 Hemorrhage of anus and rectum: Secondary | ICD-10-CM | POA: Diagnosis not present

## 2017-09-04 DIAGNOSIS — Z8619 Personal history of other infectious and parasitic diseases: Secondary | ICD-10-CM | POA: Diagnosis not present

## 2017-09-11 DIAGNOSIS — Z8619 Personal history of other infectious and parasitic diseases: Secondary | ICD-10-CM | POA: Diagnosis not present

## 2017-10-02 DIAGNOSIS — K317 Polyp of stomach and duodenum: Secondary | ICD-10-CM | POA: Diagnosis not present

## 2017-10-02 DIAGNOSIS — Z961 Presence of intraocular lens: Secondary | ICD-10-CM | POA: Diagnosis not present

## 2017-10-02 DIAGNOSIS — H401122 Primary open-angle glaucoma, left eye, moderate stage: Secondary | ICD-10-CM | POA: Diagnosis not present

## 2017-10-02 DIAGNOSIS — H18413 Arcus senilis, bilateral: Secondary | ICD-10-CM | POA: Diagnosis not present

## 2017-10-02 DIAGNOSIS — K648 Other hemorrhoids: Secondary | ICD-10-CM | POA: Diagnosis not present

## 2017-10-02 DIAGNOSIS — H11153 Pinguecula, bilateral: Secondary | ICD-10-CM | POA: Diagnosis not present

## 2017-10-02 DIAGNOSIS — H11423 Conjunctival edema, bilateral: Secondary | ICD-10-CM | POA: Diagnosis not present

## 2017-10-02 DIAGNOSIS — H0100A Unspecified blepharitis right eye, upper and lower eyelids: Secondary | ICD-10-CM | POA: Diagnosis not present

## 2017-10-02 DIAGNOSIS — H401113 Primary open-angle glaucoma, right eye, severe stage: Secondary | ICD-10-CM | POA: Diagnosis not present

## 2017-10-02 DIAGNOSIS — H0100B Unspecified blepharitis left eye, upper and lower eyelids: Secondary | ICD-10-CM | POA: Diagnosis not present

## 2017-10-02 DIAGNOSIS — H04123 Dry eye syndrome of bilateral lacrimal glands: Secondary | ICD-10-CM | POA: Diagnosis not present

## 2017-10-04 DIAGNOSIS — Z961 Presence of intraocular lens: Secondary | ICD-10-CM | POA: Diagnosis not present

## 2017-10-04 DIAGNOSIS — H401133 Primary open-angle glaucoma, bilateral, severe stage: Secondary | ICD-10-CM | POA: Diagnosis not present

## 2017-10-30 DIAGNOSIS — H401133 Primary open-angle glaucoma, bilateral, severe stage: Secondary | ICD-10-CM | POA: Diagnosis not present

## 2017-10-30 DIAGNOSIS — Z961 Presence of intraocular lens: Secondary | ICD-10-CM | POA: Diagnosis not present

## 2017-11-18 ENCOUNTER — Other Ambulatory Visit: Payer: Self-pay | Admitting: Family Medicine

## 2017-11-18 DIAGNOSIS — Z1231 Encounter for screening mammogram for malignant neoplasm of breast: Secondary | ICD-10-CM

## 2017-11-29 ENCOUNTER — Other Ambulatory Visit: Payer: Self-pay | Admitting: Physician Assistant

## 2017-11-29 DIAGNOSIS — R109 Unspecified abdominal pain: Secondary | ICD-10-CM

## 2017-11-29 DIAGNOSIS — R1115 Cyclical vomiting syndrome unrelated to migraine: Secondary | ICD-10-CM

## 2017-11-29 DIAGNOSIS — K625 Hemorrhage of anus and rectum: Secondary | ICD-10-CM | POA: Diagnosis not present

## 2017-11-29 DIAGNOSIS — R1011 Right upper quadrant pain: Secondary | ICD-10-CM

## 2017-11-29 DIAGNOSIS — Z8619 Personal history of other infectious and parasitic diseases: Secondary | ICD-10-CM | POA: Diagnosis not present

## 2017-11-29 DIAGNOSIS — G43A0 Cyclical vomiting, not intractable: Secondary | ICD-10-CM | POA: Diagnosis not present

## 2017-11-29 DIAGNOSIS — Z800 Family history of malignant neoplasm of digestive organs: Secondary | ICD-10-CM

## 2017-11-29 DIAGNOSIS — D649 Anemia, unspecified: Secondary | ICD-10-CM | POA: Diagnosis not present

## 2017-12-04 ENCOUNTER — Ambulatory Visit
Admission: RE | Admit: 2017-12-04 | Discharge: 2017-12-04 | Disposition: A | Discharge status: Home / Self Care | Payer: BLUE CROSS/BLUE SHIELD | Source: Ambulatory Visit | Attending: Physician Assistant | Admitting: Physician Assistant

## 2017-12-04 DIAGNOSIS — K760 Fatty (change of) liver, not elsewhere classified: Secondary | ICD-10-CM | POA: Diagnosis not present

## 2017-12-04 DIAGNOSIS — R109 Unspecified abdominal pain: Secondary | ICD-10-CM

## 2017-12-04 DIAGNOSIS — R1011 Right upper quadrant pain: Secondary | ICD-10-CM

## 2017-12-04 DIAGNOSIS — Z800 Family history of malignant neoplasm of digestive organs: Secondary | ICD-10-CM

## 2017-12-04 DIAGNOSIS — R1115 Cyclical vomiting syndrome unrelated to migraine: Secondary | ICD-10-CM

## 2017-12-04 MED ORDER — IOPAMIDOL (ISOVUE-300) INJECTION 61%
100.0000 mL | Freq: Once | INTRAVENOUS | Status: AC | PRN
  Administered 2017-12-04: 100 mL via INTRAVENOUS

## 2017-12-18 DIAGNOSIS — H401133 Primary open-angle glaucoma, bilateral, severe stage: Secondary | ICD-10-CM | POA: Diagnosis not present

## 2017-12-18 DIAGNOSIS — Z961 Presence of intraocular lens: Secondary | ICD-10-CM | POA: Diagnosis not present

## 2017-12-20 DIAGNOSIS — R109 Unspecified abdominal pain: Secondary | ICD-10-CM | POA: Diagnosis not present

## 2017-12-20 DIAGNOSIS — D509 Iron deficiency anemia, unspecified: Secondary | ICD-10-CM | POA: Diagnosis not present

## 2017-12-20 DIAGNOSIS — K625 Hemorrhage of anus and rectum: Secondary | ICD-10-CM | POA: Diagnosis not present

## 2018-01-01 ENCOUNTER — Ambulatory Visit
Admission: RE | Admit: 2018-01-01 | Discharge: 2018-01-01 | Disposition: A | Discharge status: Home / Self Care | Payer: BLUE CROSS/BLUE SHIELD | Source: Ambulatory Visit | Attending: Family Medicine | Admitting: Family Medicine

## 2018-01-01 DIAGNOSIS — Z1231 Encounter for screening mammogram for malignant neoplasm of breast: Secondary | ICD-10-CM | POA: Diagnosis not present

## 2018-01-20 DIAGNOSIS — D509 Iron deficiency anemia, unspecified: Secondary | ICD-10-CM | POA: Diagnosis not present

## 2018-01-20 DIAGNOSIS — N183 Chronic kidney disease, stage 3 (moderate): Secondary | ICD-10-CM | POA: Diagnosis not present

## 2018-01-20 DIAGNOSIS — I10 Essential (primary) hypertension: Secondary | ICD-10-CM | POA: Diagnosis not present

## 2018-01-20 DIAGNOSIS — Z0289 Encounter for other administrative examinations: Secondary | ICD-10-CM | POA: Diagnosis not present

## 2018-01-24 DIAGNOSIS — D125 Benign neoplasm of sigmoid colon: Secondary | ICD-10-CM | POA: Diagnosis not present

## 2018-01-24 DIAGNOSIS — D509 Iron deficiency anemia, unspecified: Secondary | ICD-10-CM | POA: Diagnosis not present

## 2018-01-24 DIAGNOSIS — D122 Benign neoplasm of ascending colon: Secondary | ICD-10-CM | POA: Diagnosis not present

## 2018-01-24 DIAGNOSIS — K625 Hemorrhage of anus and rectum: Secondary | ICD-10-CM | POA: Diagnosis not present

## 2018-01-24 DIAGNOSIS — K5730 Diverticulosis of large intestine without perforation or abscess without bleeding: Secondary | ICD-10-CM | POA: Diagnosis not present

## 2018-01-24 DIAGNOSIS — K648 Other hemorrhoids: Secondary | ICD-10-CM | POA: Diagnosis not present

## 2018-01-24 DIAGNOSIS — D123 Benign neoplasm of transverse colon: Secondary | ICD-10-CM | POA: Diagnosis not present

## 2018-01-28 DIAGNOSIS — D125 Benign neoplasm of sigmoid colon: Secondary | ICD-10-CM | POA: Diagnosis not present

## 2018-01-28 DIAGNOSIS — D122 Benign neoplasm of ascending colon: Secondary | ICD-10-CM | POA: Diagnosis not present

## 2018-01-28 DIAGNOSIS — D123 Benign neoplasm of transverse colon: Secondary | ICD-10-CM | POA: Diagnosis not present

## 2018-05-14 DIAGNOSIS — H401133 Primary open-angle glaucoma, bilateral, severe stage: Secondary | ICD-10-CM | POA: Diagnosis not present

## 2018-05-14 DIAGNOSIS — Z961 Presence of intraocular lens: Secondary | ICD-10-CM | POA: Diagnosis not present

## 2018-05-21 ENCOUNTER — Encounter (HOSPITAL_COMMUNITY): Payer: Self-pay | Admitting: Emergency Medicine

## 2018-05-21 ENCOUNTER — Ambulatory Visit (HOSPITAL_COMMUNITY)
Admission: EM | Admit: 2018-05-21 | Discharge: 2018-05-21 | Disposition: A | Discharge status: Home / Self Care | Payer: BLUE CROSS/BLUE SHIELD | Attending: Family Medicine | Admitting: Family Medicine

## 2018-05-21 DIAGNOSIS — R631 Polydipsia: Secondary | ICD-10-CM | POA: Insufficient documentation

## 2018-05-21 DIAGNOSIS — E119 Type 2 diabetes mellitus without complications: Secondary | ICD-10-CM

## 2018-05-21 DIAGNOSIS — R358 Other polyuria: Secondary | ICD-10-CM | POA: Insufficient documentation

## 2018-05-21 DIAGNOSIS — N898 Other specified noninflammatory disorders of vagina: Secondary | ICD-10-CM | POA: Insufficient documentation

## 2018-05-21 DIAGNOSIS — R3589 Other polyuria: Secondary | ICD-10-CM

## 2018-05-21 LAB — POCT URINALYSIS DIP (DEVICE)
BILIRUBIN URINE: NEGATIVE
GLUCOSE, UA: 500 mg/dL — AB
KETONES UR: NEGATIVE mg/dL
Leukocytes, UA: NEGATIVE
NITRITE: NEGATIVE
PH: 5.5 (ref 5.0–8.0)
Specific Gravity, Urine: 1.025 (ref 1.005–1.030)
Urobilinogen, UA: 0.2 mg/dL (ref 0.0–1.0)

## 2018-05-21 LAB — GLUCOSE, CAPILLARY: Glucose-Capillary: 503 mg/dL (ref 70–99)

## 2018-05-21 MED ORDER — INSULIN GLARGINE 100 UNIT/ML ~~LOC~~ SOLN: 10.0000 [IU] | Freq: Every day | SUBCUTANEOUS | 0 refills | Status: DC

## 2018-05-21 NOTE — ED Provider Notes (Signed)
[facility]   CC: Urinary frequency and vaginal itching   SUBJECTIVE:  Jamie Payne is a 68 y.o. female who complains of increased thirst, urinary frequency, urgency, and vaginal itching for the past 3 days.  Does admit to excessive caffeine intake including tea, soda, as well as cranberry juice around the time of symptoms.  Denies known hx of diabetes, but has a brother with diabetes.  Has NOT tried OTC medications.  Reports previous symptoms in the past associated with change in toilet paper or detergents, however, denies treatment for UTI or yeast at that time.  Admits to recent unintentional weight loss of 5 pounds.  Denies fever, chills, nausea, vomiting, abdominal pain, flank pain, abnormal vaginal discharge, hematuria.    LMP: No LMP recorded. Patient has had a hysterectomy.  ROS: As in HPI.  Past Medical History:  Diagnosis Date  . Glaucoma   . Hypertension   . Low hemoglobin    Past Surgical History:  Procedure Laterality Date  . BILATERAL SALPINGECTOMY    . BREAST BIOPSY Left 01/02/2017   negative  . COLONOSCOPY N/A 12/04/2013   Procedure: COLONOSCOPY;  Surgeon: Missy Sabins, MD;  Location: [place];  Service: Endoscopy;  Laterality: N/A;  . ESOPHAGOGASTRODUODENOSCOPY N/A 12/05/2013   Procedure: ESOPHAGOGASTRODUODENOSCOPY (EGD);  Surgeon: Jeryl Columbia, MD;  Location: [facility] ENDOSCOPY;  Service: Endoscopy;  Laterality: N/A;  . HERNIA REPAIR     Allergies  Allergen Reactions  . Lisinopril Anaphylaxis  . Penicillins Anaphylaxis  . Influenza Vaccine Live Other (See Comments)    "caused me to get sick"   No current facility-administered medications on file prior to encounter.    Current Outpatient Medications on File Prior to Encounter  Medication Sig Dispense Refill  . amLODipine (NORVASC) 10 MG tablet Take 10 mg by mouth daily.    . cloNIDine (CATAPRES) 0.1 MG tablet Take 0.1 mg by mouth 2 (two) times daily.    . iron polysaccharides (NIFEREX) 150 MG  capsule Take 1 capsule (150 mg total) by mouth daily. 30 capsule 1  . brimonidine-timolol (COMBIGAN) 0.2-0.5 % ophthalmic solution Place 1 drop into both eyes every 12 (twelve) hours.    . meloxicam (MOBIC) 15 MG tablet Take 1 tablet (15 mg total) by mouth daily. 30 tablet 3  . travoprost, benzalkonium, (TRAVATAN) 0.004 % ophthalmic solution Place 1 drop into both eyes at bedtime.    Marland Kitchen UNABLE TO FIND Ms. Asmi Fugere was admitted to the hospital from 12/02/13 through 12/05/13. 1 Act 0   Social History   Socioeconomic History  . Marital status: Married    Spouse name: Not on file  . Number of children: Not on file  . Years of education: Not on file  . Highest education level: Not on file  Occupational History  . Not on file  Social Needs  . Financial resource strain: Not on file  . Food insecurity:    Worry: Not on file    Inability: Not on file  . Transportation needs:    Medical: Not on file    Non-medical: Not on file  Tobacco Use  . Smoking status: Never Smoker  . Smokeless tobacco: Never Used  Substance and Sexual Activity  . Alcohol use: No  . Drug use: No  . Sexual activity: Not on file  Lifestyle  . Physical activity:    Days per week: Not on file    Minutes per session: Not on file  . Stress: Not on file  Relationships  . Social connections:    Talks on phone: Not on file    Gets together: Not on file    Attends religious service: Not on file    Active member of club or organization: Not on file    Attends meetings of clubs or organizations: Not on file    Relationship status: Not on file  . Intimate partner violence:    Fear of current or ex partner: Not on file    Emotionally abused: Not on file    Physically abused: Not on file    Forced sexual activity: Not on file  Other Topics Concern  . Not on file  Social History Narrative  . Not on file   Family History  Problem Relation Age of Onset  . Breast cancer Maternal Grandmother   . Breast cancer  Paternal Grandmother     OBJECTIVE:  Vitals:   05/21/18 0907  BP: (!) 149/91  Pulse: 83  Resp: 16  Temp: 98.2 F (36.8 C)  TempSrc: Oral  SpO2: 96%   General appearance: AOx3 in no acute distress HEENT: NCAT.  Oropharynx clear.  Lungs: clear to auscultation bilaterally without adventitious breath sounds Heart: regular rate and rhythm.  Radial pulses 2+ symmetrical bilaterally Abdomen: soft; protuberant; non-distended; no tenderness; bowel sounds present; no guarding Back: no CVA tenderness Extremities: symmetrical with no gross deformities Skin: warm and dry Neurologic: Ambulates from chair to exam table without difficulty Psychological: alert and cooperative; normal mood and affect  Labs Reviewed  GLUCOSE, CAPILLARY - Abnormal; Notable for the following components:      Result Value   Glucose-Capillary 503 (*)    All other components within normal limits  POCT URINALYSIS DIP (DEVICE) - Abnormal; Notable for the following components:   Glucose, UA 500 (*)    Hgb urine dipstick TRACE (*)    Protein, ur >=300 (*)    All other components within normal limits  CBG MONITORING, ED    ASSESSMENT & PLAN:  1. New onset type 2 diabetes mellitus ([place])     Meds ordered this encounter  Medications  . insulin glargine (LANTUS) 100 UNIT/ML injection    Sig: Inject 0.1 mLs (10 Units total) into the skin daily.    Dispense:  10 mL    Refill:  0    Order Specific Question:   Supervising Provider    Answer:   Raylene Everts [[id number]]   Urine did not show signs of UTI, but glucose and protein in urine. To return in 2-3 days to have blood sugar rechecked for nurse visit.   Sugar elevated in office, 503 We will start you on insulin (lantus) today.  10 Units daily per Dr. Roma Kayser recommendation.. Please take as directed and follow up with PCP for further evaluation and management of diabetes Eat a well-balanced diet of fruits and vegetables.  Drink at least half your body  weight in ounces Return or go to the ED if you have any new or worsening symptoms such as fever, chills, nausea, vomiting, abdominal pain, changes in urinary or bowel habits, weakness, fatigue, confusion, blurred vision, chest pain, shortness of breath, etc...  Outlined signs and symptoms indicating need for more acute intervention. Patient verbalized understanding. After Visit Summary given.     Lestine Box, PA-C 05/21/18 1043

## 2018-05-21 NOTE — ED Triage Notes (Signed)
Pt presents to [facility] for assessment of vaginal itching x 3 days, with some frequency and occasional burning with urination.  Pt states she has had this issue before when they changes toilet paper and detergents.

## 2018-05-21 NOTE — ED Notes (Signed)
Patient able to ambulate independently  

## 2018-05-21 NOTE — Discharge Instructions (Addendum)
To return in 2-3 days to have blood sugar rechecked for nurse visit.   Sugar elevated in office, 503 We will start you on insulin today Please take as directed and follow up with PCP for further evaluation and management of diabetes Eat a well-balanced diet of fruits and vegetables.  Drink at least half your body weight in ounces Return or go to the ED if you have any new or worsening symptoms such as fever, chills, nausea, vomiting, abdominal pain, changes in urinary or bowel habits, weakness, fatigue, confusion, blurred vision, chest pain, shortness of breath, etc..Marland Kitchen

## 2018-05-22 ENCOUNTER — Encounter (HOSPITAL_COMMUNITY): Payer: Self-pay

## 2018-05-22 ENCOUNTER — Emergency Department (HOSPITAL_COMMUNITY)
Admission: EM | Admit: 2018-05-22 | Discharge: 2018-05-23 | Disposition: A | Discharge status: Home / Self Care | Payer: BLUE CROSS/BLUE SHIELD | Attending: Emergency Medicine | Admitting: Emergency Medicine

## 2018-05-22 DIAGNOSIS — R079 Chest pain, unspecified: Secondary | ICD-10-CM | POA: Diagnosis not present

## 2018-05-22 DIAGNOSIS — D649 Anemia, unspecified: Secondary | ICD-10-CM | POA: Diagnosis not present

## 2018-05-22 DIAGNOSIS — Z79899 Other long term (current) drug therapy: Secondary | ICD-10-CM | POA: Insufficient documentation

## 2018-05-22 DIAGNOSIS — I10 Essential (primary) hypertension: Secondary | ICD-10-CM | POA: Diagnosis not present

## 2018-05-22 DIAGNOSIS — R350 Frequency of micturition: Secondary | ICD-10-CM | POA: Diagnosis present

## 2018-05-22 DIAGNOSIS — R739 Hyperglycemia, unspecified: Secondary | ICD-10-CM

## 2018-05-22 DIAGNOSIS — I4510 Unspecified right bundle-branch block: Secondary | ICD-10-CM | POA: Diagnosis not present

## 2018-05-22 DIAGNOSIS — E1169 Type 2 diabetes mellitus with other specified complication: Secondary | ICD-10-CM | POA: Diagnosis not present

## 2018-05-22 DIAGNOSIS — R631 Polydipsia: Secondary | ICD-10-CM | POA: Diagnosis not present

## 2018-05-22 DIAGNOSIS — E1165 Type 2 diabetes mellitus with hyperglycemia: Secondary | ICD-10-CM | POA: Insufficient documentation

## 2018-05-22 DIAGNOSIS — E782 Mixed hyperlipidemia: Secondary | ICD-10-CM | POA: Diagnosis not present

## 2018-05-22 DIAGNOSIS — B373 Candidiasis of vulva and vagina: Secondary | ICD-10-CM | POA: Diagnosis not present

## 2018-05-22 LAB — URINALYSIS, ROUTINE W REFLEX MICROSCOPIC
Bilirubin Urine: NEGATIVE
Glucose, UA: >=500 mg/dL — AB
Ketones, ur: NEGATIVE mg/dL
Nitrite: NEGATIVE
Protein, ur: NEGATIVE mg/dL
Specific Gravity, Urine: 1.024 (ref 1.005–1.030)
pH: 5 (ref 5.0–8.0)

## 2018-05-22 LAB — CBG MONITORING, ED
GLUCOSE-CAPILLARY: 567 mg/dL — AB (ref 70–99)
Glucose-Capillary: 464 mg/dL — ABNORMAL HIGH (ref 70–99)

## 2018-05-22 LAB — CBC
HCT: 40.8 % (ref 36.0–46.0)
Hemoglobin: 12.9 g/dL (ref 12.0–15.0)
MCH: 26.2 pg (ref 26.0–34.0)
MCHC: 31.6 g/dL (ref 30.0–36.0)
MCV: 82.9 fL (ref 80.0–100.0)
Platelets: 224 10*3/uL (ref 150–400)
RBC: 4.92 MIL/uL (ref 3.87–5.11)
RDW: 12.7 % (ref 11.5–15.5)
WBC: 7.6 10*3/uL (ref 4.0–10.5)
nRBC: 0 % (ref 0.0–0.2)

## 2018-05-22 LAB — BASIC METABOLIC PANEL
Anion gap: 11 (ref 5–15)
BUN: 23 mg/dL (ref 8–23)
CO2: 22 mmol/L (ref 22–32)
Calcium: 10.1 mg/dL (ref 8.9–10.3)
Chloride: 98 mmol/L (ref 98–111)
Creatinine, Ser: 1.53 mg/dL — ABNORMAL HIGH (ref 0.44–1.00)
GFR calc Af Amer: 40 mL/min — ABNORMAL LOW (ref >60)
GFR calc non Af Amer: 34 mL/min — ABNORMAL LOW (ref >60)
GLUCOSE: 605 mg/dL — AB (ref 70–99)
Potassium: 3.7 mmol/L (ref 3.5–5.1)
Sodium: 131 mmol/L — ABNORMAL LOW (ref 135–145)

## 2018-05-22 MED ORDER — INSULIN ASPART 100 UNIT/ML ~~LOC~~ SOLN
5.0000 [IU] | Freq: Once | SUBCUTANEOUS | Status: AC
  Administered 2018-05-23: 5 [IU] via INTRAVENOUS

## 2018-05-22 MED ORDER — INSULIN ASPART 100 UNIT/ML ~~LOC~~ SOLN: 10.0000 [IU] | Freq: Once | SUBCUTANEOUS | Status: DC

## 2018-05-22 MED ORDER — SODIUM CHLORIDE 0.9 % IV BOLUS
1000.0000 mL | Freq: Once | INTRAVENOUS | Status: AC
  Administered 2018-05-23: 1000 mL via INTRAVENOUS

## 2018-05-22 NOTE — ED Notes (Signed)
Pt CBG was 464, notified Woody(RN)

## 2018-05-22 NOTE — ED Provider Notes (Signed)
[facility] EMERGENCY DEPARTMENT Provider Note   CSN: [id number] Arrival date & time: 05/22/18  1815     History   Chief Complaint Chief Complaint  Patient presents with  . Hyperglycemia    HPI Jamie Payne is a 68 y.o. female with a hx of anemia, glaucoma, HTN presents to the Emergency Department complaining of gradual, persistent, progressively worsening polyuria, polydipsia onset 3 days ago.  Pt reports she was evaluated at [facility] and found to have a CBG of 503.  She was prescribed insulin but has not yet learned how to use it.  Pt is scheduled to go to Dr. Su Hoff office tomorrow to learn about her insulin.  Pt reports her A1C was 10.5.  She has never been diagnosed with diabetes or had elevated blood sugars.  Associated symptoms include vaginal itching. Pt reports this began 3 days ago and was given Diflucan by Dr. Rowland Lathe. No known aggravating or alleviating factors.  Pt denies fever, chills, headache, neck pain, chest pain, SOB, abd pain, nausea, vomiting.  Pt reports Dr. Rowland Lathe felt as if she should come to the ED for treatment.      The history is provided by the patient and medical records. No language interpreter was used.    Past Medical History:  Diagnosis Date  . Glaucoma   . Hypertension   . Low hemoglobin     Patient Active Problem List   Diagnosis Date Noted  . Acute on chronic renal failure ([place]) 12/03/2013  . GI bleed 12/02/2013  . Acute blood loss anemia 12/02/2013  . ARF (acute renal failure) ([place]) 12/02/2013  . HTN (hypertension) 12/02/2013  . Glaucoma 12/02/2013    Past Surgical History:  Procedure Laterality Date  . BILATERAL SALPINGECTOMY    . BREAST BIOPSY Left 01/02/2017   negative  . COLONOSCOPY N/A 12/04/2013   Procedure: COLONOSCOPY;  Surgeon: Missy Sabins, MD;  Location: [place];  Service: Endoscopy;  Laterality: N/A;  . ESOPHAGOGASTRODUODENOSCOPY N/A 12/05/2013   Procedure: ESOPHAGOGASTRODUODENOSCOPY (EGD);  Surgeon: Jeryl Columbia, MD;  [facility] END[facility]COPY;  Service: Endoscopy;  Laterality: N/A;  . HERNIA REPAIR       OB History   No obstetric history on file.      Home Medications    Prior to Admission medications   Medication Sig Start Date End Date Taking? Authorizing Provider  amLODipine (NORVASC) 10 MG tablet Take 10 mg by mouth daily.   Yes [provider]  cloNIDine (CATAPRES) 0.1 MG tablet Take 0.1 mg by mouth 2 (two) times daily.   Yes [provider]  dorzolamide-timolol (COSOPT) 22.3-6.8 MG/ML ophthalmic solution Place 1 drop into the left eye 2 (two) times daily. 05/10/18  Yes [provider]  iron polysaccharides (NIFEREX) 150 MG capsule Take 1 capsule (150 mg total) by mouth daily. 12/05/13  Yes Tat, Shanon Brow, MD  insulin glargine (LANTUS) 100 UNIT/ML injection Inject 0.1 mLs (10 Units total) into the skin daily. 05/21/18   Wurst, [place], PA-C  meloxicam (MOBIC) 15 MG tablet Take 1 tablet (15 mg total) by mouth daily. Patient not taking: Reported on 05/23/2018 02/01/15   Garrel Ridgel, [place]  UNABLE TO FIND Ms. Bristol Osentoski was admitted to the hospital from 12/02/13 through 12/05/13. Patient not taking: Reported on 05/23/2018 12/05/13   Orson Eva, MD    Family History Family History  Problem Relation Age of Onset  . Breast cancer Maternal Grandmother   . Breast cancer Paternal Grandmother  Social History Social History   Tobacco Use  . Smoking status: Never Smoker  . Smokeless tobacco: Never Used  Substance Use Topics  . Alcohol use: No  . Drug use: No     Allergies   Lisinopril; Penicillins; and Influenza vaccine live   Review of Systems Review of Systems  Constitutional: Negative for appetite change, diaphoresis, fatigue, fever and unexpected weight change.  HENT: Negative for mouth sores.   Eyes: Negative for visual disturbance.  Respiratory: Negative for cough, chest tightness, shortness of breath and wheezing.   Cardiovascular: Negative for  chest pain.  Gastrointestinal: Negative for abdominal pain, constipation, diarrhea, nausea and vomiting.  Endocrine: Positive for polydipsia and polyuria. Negative for polyphagia.  Genitourinary: Negative for dysuria, frequency, hematuria and urgency.  Musculoskeletal: Negative for back pain and neck stiffness.  Skin: Negative for rash.  Allergic/Immunologic: Negative for immunocompromised state.  Neurological: Negative for syncope, light-headedness and headaches.  Hematological: Does not bruise/bleed easily.  Psychiatric/Behavioral: Negative for sleep disturbance. The patient is not nervous/anxious.      Physical Exam Updated Vital Signs BP (!) 154/86   Pulse 63   Temp 98.7 F (37.1 C) (Oral)   Resp 17   SpO2 99%   Physical Exam Vitals signs and nursing note reviewed.  Constitutional:      General: She is not in acute distress.    Appearance: She is well-developed. She is not diaphoretic.     Comments: Awake, alert, nontoxic appearance  HENT:     Head: Normocephalic and atraumatic.     Mouth/Throat:     Pharynx: No oropharyngeal exudate.  Eyes:     General: No scleral icterus.    Conjunctiva/sclera: Conjunctivae normal.  Neck:     Musculoskeletal: Normal range of motion and neck supple.  Cardiovascular:     Rate and Rhythm: Normal rate and regular rhythm.  Pulmonary:     Effort: Pulmonary effort is normal. No respiratory distress.     Breath sounds: Normal breath sounds. No wheezing.  Abdominal:     General: Bowel sounds are normal.     Palpations: Abdomen is soft. There is no mass.     Tenderness: There is no abdominal tenderness. There is no guarding or rebound.  Musculoskeletal: Normal range of motion.  Skin:    General: Skin is warm and dry.  Neurological:     Mental Status: She is alert.     Comments: Speech is clear and goal oriented Moves extremities without ataxia      ED Treatments / Results  Labs (all labs ordered are listed, but only abnormal  results are displayed) Labs Reviewed  BASIC METABOLIC PANEL - Abnormal; Notable for the following components:      Result Value   Sodium 131 (*)    Glucose, Bld 605 (*)    Creatinine, Ser 1.53 (*)    GFR calc non Af Amer 34 (*)    GFR calc Af Amer 40 (*)    All other components within normal limits  URINALYSIS, ROUTINE W REFLEX MICROSCOPIC - Abnormal; Notable for the following components:   Color, Urine STRAW (*)    Glucose, UA >=500 (*)    Hgb urine dipstick SMALL (*)    Leukocytes, UA TRACE (*)    Bacteria, UA RARE (*)    All other components within normal limits  CBG MONITORING, ED - Abnormal; Notable for the following components:   Glucose-Capillary 567 (*)    All other components within normal limits  CBG MONITORING, ED - Abnormal; Notable for the following components:   Glucose-Capillary 464 (*)    All other components within normal limits  CBG MONITORING, ED - Abnormal; Notable for the following components:   Glucose-Capillary 416 (*)    All other components within normal limits  CBG MONITORING, ED - Abnormal; Notable for the following components:   Glucose-Capillary 384 (*)    All other components within normal limits  CBG MONITORING, ED - Abnormal; Notable for the following components:   Glucose-Capillary 371 (*)    All other components within normal limits  CBC    Procedures Procedures (including critical care time)  Medications Ordered in ED Medications  sodium chloride 0.9 % bolus 1,000 mL (0 mLs Intravenous Stopped 05/23/18 0125)  insulin aspart (novoLOG) injection 5 Units (5 Units Intravenous Given 05/23/18 0000)  sodium chloride 0.9 % bolus 1,000 mL (0 mLs Intravenous Stopped 05/23/18 0227)  insulin aspart (novoLOG) injection 5 Units (5 Units Subcutaneous Given 05/23/18 0342)     Initial Impression / Assessment and Plan / ED Course  I have reviewed the triage vital signs and the nursing notes.  Pertinent labs & imaging results that were available during my  care of the patient were reviewed by me and considered in my medical decision making (see chart for details).     Patient with polyuria, polydipsia and elevated glucose.  New diagnosis of diabetes.  Patient is not taking medications.  Labs show initial glucose of 605.  Patient without elevated anion gap and no ketones in her urine.  She does have glucose in her urine along with trace leukocytes but no white blood cells.  Patient given fluids and insulin here in the emergency department with a decrease to 371.  She reports she is feeling better at this time.  Patient had not been taking her insulin over the last several days because she has not been trying to use it yet.  She does have an appointment with her primary care doctor later today for new or diabetes discussion and training.  Request that she keep this appointment for repeat blood sugar check and insulin training.  She is to return immediately here to the emergency department for new or worsening symptoms, lightheadedness, syncope, vomiting or other concerns.  Patient states understanding and is in agreement with the plan.  The patient was discussed with and seen by Dr. Roxanne Mins who agrees with the treatment plan.   Final Clinical Impressions(s) / ED Diagnoses   Final diagnoses:  Hyperglycemia    ED Discharge Orders    None       Loni Muse Gwenlyn Perking 16/10/96 0454    Delora Fuel, MD 09/81/19 [phone]

## 2018-05-22 NOTE — ED Triage Notes (Signed)
Pt endorses being sent by pcp for CBG greater than 600. Diagnosed with DM yesterday. Endorses excessive thirst and frequent urination. Denies n/v/d or abd pain. VSS

## 2018-05-23 ENCOUNTER — Telehealth: Payer: Self-pay

## 2018-05-23 DIAGNOSIS — E1165 Type 2 diabetes mellitus with hyperglycemia: Secondary | ICD-10-CM | POA: Diagnosis not present

## 2018-05-23 LAB — CBG MONITORING, ED
Glucose-Capillary: 371 mg/dL — ABNORMAL HIGH (ref 70–99)
Glucose-Capillary: 384 mg/dL — ABNORMAL HIGH (ref 70–99)
Glucose-Capillary: 416 mg/dL — ABNORMAL HIGH (ref 70–99)

## 2018-05-23 MED ORDER — INSULIN ASPART 100 UNIT/ML ~~LOC~~ SOLN
5.0000 [IU] | Freq: Once | SUBCUTANEOUS | Status: AC
  Administered 2018-05-23: 5 [IU] via SUBCUTANEOUS

## 2018-05-23 MED ORDER — SODIUM CHLORIDE 0.9 % IV BOLUS
1000.0000 mL | Freq: Once | INTRAVENOUS | Status: AC
  Administered 2018-05-23: 1000 mL via INTRAVENOUS

## 2018-05-23 NOTE — ED Notes (Signed)
Here for high blood sugar.  Patient is new onset diabetic.

## 2018-05-23 NOTE — Telephone Encounter (Signed)
Records sent to scheduling, notes on file.

## 2018-05-23 NOTE — ED Notes (Signed)
Pt CBG was 371, notified Woody(RN)

## 2018-05-23 NOTE — ED Notes (Signed)
Pt CBG was 416, notified Woody(RN)

## 2018-05-23 NOTE — Discharge Instructions (Addendum)
1. Medications: usual home medications 2. Treatment: rest, drink plenty of fluids,  3. Follow Up: Please followup with your primary doctor TODAY for discussion of your diagnoses and further evaluation after today's visit; if you do not have a primary care doctor use the resource guide provided to find one; Please return to the ER for sugars that remain high, persistent feelings of thirst and large volume of urination.

## 2018-05-29 ENCOUNTER — Other Ambulatory Visit: Payer: Self-pay | Admitting: Gastroenterology

## 2018-05-29 DIAGNOSIS — R7989 Other specified abnormal findings of blood chemistry: Secondary | ICD-10-CM

## 2018-05-29 DIAGNOSIS — K760 Fatty (change of) liver, not elsewhere classified: Secondary | ICD-10-CM | POA: Diagnosis not present

## 2018-05-29 DIAGNOSIS — R945 Abnormal results of liver function studies: Secondary | ICD-10-CM

## 2018-05-29 DIAGNOSIS — K317 Polyp of stomach and duodenum: Secondary | ICD-10-CM | POA: Diagnosis not present

## 2018-06-04 ENCOUNTER — Ambulatory Visit
Admission: RE | Admit: 2018-06-04 | Discharge: 2018-06-04 | Disposition: A | Discharge status: Home / Self Care | Payer: BLUE CROSS/BLUE SHIELD | Source: Ambulatory Visit | Attending: Gastroenterology | Admitting: Gastroenterology

## 2018-06-04 DIAGNOSIS — K760 Fatty (change of) liver, not elsewhere classified: Secondary | ICD-10-CM

## 2018-06-04 DIAGNOSIS — R945 Abnormal results of liver function studies: Secondary | ICD-10-CM

## 2018-06-04 DIAGNOSIS — R7989 Other specified abnormal findings of blood chemistry: Secondary | ICD-10-CM

## 2018-06-06 DIAGNOSIS — K317 Polyp of stomach and duodenum: Secondary | ICD-10-CM | POA: Diagnosis not present

## 2018-06-06 DIAGNOSIS — K5710 Diverticulosis of small intestine without perforation or abscess without bleeding: Secondary | ICD-10-CM | POA: Diagnosis not present

## 2018-06-06 DIAGNOSIS — K2940 Chronic atrophic gastritis without bleeding: Secondary | ICD-10-CM | POA: Diagnosis not present

## 2018-06-12 DIAGNOSIS — K317 Polyp of stomach and duodenum: Secondary | ICD-10-CM | POA: Diagnosis not present

## 2018-06-12 DIAGNOSIS — K2940 Chronic atrophic gastritis without bleeding: Secondary | ICD-10-CM | POA: Diagnosis not present

## 2018-06-20 ENCOUNTER — Ambulatory Visit: Payer: BLUE CROSS/BLUE SHIELD | Admitting: Internal Medicine

## 2018-07-14 ENCOUNTER — Encounter: Payer: Self-pay | Admitting: Internal Medicine

## 2018-07-14 ENCOUNTER — Other Ambulatory Visit: Payer: Self-pay

## 2018-07-14 ENCOUNTER — Ambulatory Visit (INDEPENDENT_AMBULATORY_CARE_PROVIDER_SITE_OTHER): Payer: BLUE CROSS/BLUE SHIELD | Admitting: Internal Medicine

## 2018-07-14 ENCOUNTER — Encounter (INDEPENDENT_AMBULATORY_CARE_PROVIDER_SITE_OTHER): Payer: Self-pay

## 2018-07-14 VITALS — BP 146/93 | HR 93 | Ht 66.0 in | Wt 206.0 lb

## 2018-07-14 DIAGNOSIS — R079 Chest pain, unspecified: Secondary | ICD-10-CM | POA: Diagnosis not present

## 2018-07-14 DIAGNOSIS — I4510 Unspecified right bundle-branch block: Secondary | ICD-10-CM | POA: Diagnosis not present

## 2018-07-14 NOTE — Progress Notes (Signed)
Cardiology Office Note:    Date:  07/14/2018   ID:  Jamie Payne, DOB 1950-06-22, MRN [id number]  PCP:  Antony Contras, MD  Cardiologist:  No primary care provider on file.  Electrophysiologist:  None   Referring MD: Antony Contras, MD   Chest pain, right bundle branch block  History of Present Illness:    Jamie Payne is a 68 y.o. female with a hx of glaucoma, HTN, and family history of heart disease who presents today for atypical chest pain and ECG documenting right bundle branch block.  She tells me that approximately once per week she will get a sensation primarily in the right side of her chest, however sometimes will occur in the left side of her chest, lasting usually less than a minute but had one episode approximately a month ago that lasted more than a minute of sharp anterior chest pain.  She tells me that her mother passed away in 2023/03/24 and had atrial fibrillation.  She has been grieving since that time, and feels that her chest discomfort may be in relation to grief.  I have expressed my condolences, and also explained to the patient that emotional stress as a provoking factor for chest pain can sometimes be a concerning finding.  She feels that the symptoms have been going on for approximately a month, perhaps longer.  Her family history is significant for hypertension, cancer, and diabetes mellitus.  Her brother had sudden cardiac death from MI at age 60, and as noted above her mother had atrial fibrillation.  She has no other known family history of early MI or sudden cardiac death.  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  There is a small incline associated with her walk, and she denies any sense of chest discomfort that occurs with exertion.  She denies limiting shortness of breath.  She denies syncope or lightheadedness.  She feels that her discomfort in her chest very  often occurs in the morning, and it will occur sometimes after she takes her morning medications.  She does have active gastrointestinal issues, with hyperplastic polyps of the stomach which will cause her significant lower GI distress.  She is not currently taking a daily medication for this, and does not experience significant acid reflux or GERD.  The patient denies dyspnea at rest or with exertion, palpitations, PND, orthopnea, or leg swelling. Denies syncope or presyncope. Denies dizziness or lightheadedness.  Occasional snoring and has not been evaluated for sleep apnea.  Her last cardiovascular evaluation was performed in 2010 with reportedly normal echo and stress test.   Past Medical History:  Diagnosis Date   Glaucoma    Hypertension    Low hemoglobin     Past Surgical History:  Procedure Laterality Date   BILATERAL SALPINGECTOMY     BREAST BIOPSY Left 01/02/2017   negative   COLONOSCOPY N/A 12/04/2013   Procedure: COLONOSCOPY;  Surgeon: Missy Sabins, MD;  Location: [facility];  Service: Endoscopy;  Laterality: N/A;   ESOPHAGOGASTRODUODENOSCOPY N/A 12/05/2013   Procedure: ESOPHAGOGASTRODUODENOSCOPY (EGD);  Surgeon: Jeryl Columbia, MD;  [facility] ENDOSCOPY;  Service: Endoscopy;  Laterality: N/A;   HERNIA REPAIR      Current Medications: Current Meds  Medication Sig   amLODipine (NORVASC) 10 MG tablet Take 10 mg by mouth daily.   cloNIDine (CATAPRES) 0.1 MG tablet Take 0.1 mg by mouth 2 (  two) times daily.   dorzolamide-timolol (COSOPT) 22.3-6.8 MG/ML ophthalmic solution Place 1 drop into the left eye 2 (two) times daily.   insulin glargine (LANTUS) 100 UNIT/ML injection Inject 0.1 mLs (10 Units total) into the skin daily.   iron polysaccharides (NIFEREX) 150 MG capsule Take 1 capsule (150 mg total) by mouth daily.   meloxicam (MOBIC) 15 MG tablet Take 1 tablet (15 mg total) by mouth daily.   UNABLE TO FIND Ms. Navi Ewton was admitted to the hospital from  12/02/13 through 12/05/13.     Allergies:   Lisinopril; Penicillins; and Influenza vaccine live   Social History   Socioeconomic History   Marital status: Married    Spouse name: Not on file   Number of children: Not on file   Years of education: Not on file   Highest education level: Not on file  Occupational History   Not on file  Social Needs   Financial resource strain: Not on file   Food insecurity:    Worry: Not on file    Inability: Not on file   Transportation needs:    Medical: Not on file    Non-medical: Not on file  Tobacco Use   Smoking status: Never Smoker   Smokeless tobacco: Never Used  Substance and Sexual Activity   Alcohol use: No   Drug use: No   Sexual activity: Not on file  Lifestyle   Physical activity:    Days per week: Not on file    Minutes per session: Not on file   Stress: Not on file  Relationships   Social connections:    Talks on phone: Not on file    Gets together: Not on file    Attends religious service: Not on file    Active member of club or organization: Not on file    Attends meetings of clubs or organizations: Not on file    Relationship status: Not on file  Other Topics Concern   Not on file  Social History Narrative   Not on file     Family History: The patient's family history includes Bipolar disorder in her daughter and daughter; Breast cancer in her maternal grandmother and paternal grandmother; Diabetes in her mother; Hypertension in her father and mother; Pancreatic cancer in her brother and brother; Renal Disease in her father.  ROS:   Please see the history of present illness.    All other systems reviewed and are negative.  EKGs/Labs/Other Studies Reviewed:    The following studies were reviewed today:  EKG:  NSR, RBBB, LAFB, HR 74 bpm  Recent Labs: 05/22/2018: BUN 23; Creatinine, Ser 1.53; Hemoglobin 12.9; Platelets 224; Potassium 3.7; Sodium 131  Recent Lipid Panel No results found  for: CHOL, TRIG, HDL, CHOLHDL, VLDL, LDLCALC, LDLDIRECT  Physical Exam:    VS:  BP (!) 146/93 (BP Location: Left Arm)    Pulse 93    Ht 5\' 6"  (1.676 m)    Wt 206 lb (93.4 kg)    BMI 33.25 kg/m     Wt Readings from Last 3 Encounters:  07/14/18 206 lb (93.4 kg)  02/01/15 215 lb (97.5 kg)  12/02/13 206 lb 6.4 oz (93.6 kg)     Constitutional: No acute distress Eyes: pupils equally round and reactive to light, sclera non-icteric, normal conjunctiva and lids ENMT: normal dentition, moist mucous membranes Cardiovascular: regular rhythm, normal rate, no murmurs. S1 and S2 normal. Radial pulses normal bilaterally. No jugular venous distention.  Respiratory: clear  to auscultation bilaterally GI : normal bowel sounds, soft and nontender. No distention.   MSK: extremities warm, well perfused. No edema.  NEURO: grossly nonfocal exam, moves all extremities. PSYCH: alert and oriented x 3, normal mood and affect.   ASSESSMENT:    1. Chest pain, unspecified type    PLAN:    We will obtain a nuclear stress test and echocardiogram to check for ischemia and evaluate for structural issues contributing to chest pain associated with a recent emotional stressor. I am concerned about her chest pain in the setting of DM, family history, and HTN.  HTN - on amlodipine and clonidine, continue for now pending results of testing.   DM2 - on therapy per primary,we discussed dietary recommendations for best CV and DM health.    Medication Adjustments/Labs and Tests Ordered: Current medicines are reviewed at length with the patient today.  Concerns regarding medicines are outlined above.  Orders Placed This Encounter  Procedures   Myocardial Perfusion Imaging   EKG 12-Lead   ECHOCARDIOGRAM COMPLETE   No orders of the defined types were placed in this encounter.   Patient Instructions  Medication Instructions:  Dr. Margaretann Loveless recommends that you continue on your current medications as directed. Please  refer to the Current Medication list given to you today.  If you need a refill on your cardiac medications before your next appointment, please call your pharmacy.   Lab work: None ordered   Testing/Procedures: Dr.Ahsley Attwood has requested that you have an echocardiogram. Echocardiography is a painless test that uses sound waves to create images of your heart. It provides your doctor with information about the size and shape of your heart and how well your hearts chambers and valves are working. This procedure takes approximately one hour. There are no restrictions for this procedure.  Dr.Edell Mesenbrink has requested that you have en exercise stress myoview. For further information please visit [email or web address]. Please follow instruction sheet, as given. (To be scheduled at the [facility] office)    Follow-Up: At [facility], you and your health needs are our priority.  As part of our continuing mission to provide you with exceptional heart care, we have created designated Provider Care Teams.  These Care Teams include your primary Cardiologist (physician) and Advanced Practice Providers (APPs -  Physician Assistants and Nurse Practitioners) who all work together to provide you with the care you need, when you need it. You will need a follow up appointment pending test results.   You may see  Dr.Islay Polanco or one of the following Advanced Practice Providers on your designated Care Team:   Rosaria Ferries, PA-C  Jory Sims, DNP, ANP        Signed, Elouise Munroe, MD  07/14/2018 2:57 PM    [place]

## 2018-07-14 NOTE — Patient Instructions (Signed)
Medication Instructions:  Dr. Margaretann Loveless recommends that you continue on your current medications as directed. Please refer to the Current Medication list given to you today.  If you need a refill on your cardiac medications before your next appointment, please call your pharmacy.   Lab work: None ordered   Testing/Procedures: Dr.Acharya has requested that you have an echocardiogram. Echocardiography is a painless test that uses sound waves to create images of your heart. It provides your doctor with information about the size and shape of your heart and how well your heart's chambers and valves are working. This procedure takes approximately one hour. There are no restrictions for this procedure.  Dr.Acharya has requested that you have en exercise stress myoview. For further information please visit [email or web address]. Please follow instruction sheet, as given. (To be scheduled at the [facility] office)    Follow-Up: At [facility], you and your health needs are our priority.  As part of our continuing mission to provide you with exceptional heart care, we have created designated Provider Care Teams.  These Care Teams include your primary Cardiologist (physician) and Advanced Practice Providers (APPs -  Physician Assistants and Nurse Practitioners) who all work together to provide you with the care you need, when you need it. You will need a follow up appointment pending test results.   You may see  Dr.Acharya or one of the following Advanced Practice Providers on your designated Care Team:   Rosaria Ferries, PA-C . Jory Sims, DNP, ANP

## 2018-08-06 ENCOUNTER — Ambulatory Visit (HOSPITAL_BASED_OUTPATIENT_CLINIC_OR_DEPARTMENT_OTHER): Payer: BLUE CROSS/BLUE SHIELD

## 2018-08-06 ENCOUNTER — Other Ambulatory Visit: Payer: Self-pay

## 2018-08-06 ENCOUNTER — Ambulatory Visit (HOSPITAL_COMMUNITY): Payer: BLUE CROSS/BLUE SHIELD | Attending: Cardiology

## 2018-08-06 DIAGNOSIS — R079 Chest pain, unspecified: Secondary | ICD-10-CM | POA: Diagnosis not present

## 2018-08-06 LAB — MYOCARDIAL PERFUSION IMAGING
Estimated workload: 4.6 METS
Exercise duration (min): 4 min
Exercise duration (sec): 0 s
LV dias vol: 76 mL (ref 46–106)
LV sys vol: 25 mL
MPHR: 151 {beats}/min
Peak HR: 137 {beats}/min
Percent HR: 90 %
Rest HR: 73 {beats}/min
SDS: 0
SRS: 0
SSS: 0
TID: 1.02

## 2018-08-06 MED ORDER — TECHNETIUM TC 99M TETROFOSMIN IV KIT
31.1000 | PACK | Freq: Once | INTRAVENOUS | Status: AC | PRN
  Administered 2018-08-06: 31.1 via INTRAVENOUS
  Filled 2018-08-06: qty 32

## 2018-08-06 MED ORDER — TECHNETIUM TC 99M TETROFOSMIN IV KIT
10.8000 | PACK | Freq: Once | INTRAVENOUS | Status: AC | PRN
  Administered 2018-08-06: 10.8 via INTRAVENOUS
  Filled 2018-08-06: qty 11

## 2018-08-07 ENCOUNTER — Ambulatory Visit (HOSPITAL_COMMUNITY): Payer: BLUE CROSS/BLUE SHIELD

## 2018-08-11 ENCOUNTER — Telehealth: Payer: Self-pay | Admitting: *Deleted

## 2018-08-11 NOTE — Telephone Encounter (Signed)
-----   Message from Elouise Munroe, MD sent at 08/11/2018  8:15 AM EDT ----- Normal LV Function. Findings suggest we can optimize anti-HTN therapy, let's discuss in a telehealth video visit next week.

## 2018-08-11 NOTE — Telephone Encounter (Signed)
Left message to call back - need to give results and set up an e-visit with Dr Margaretann Loveless  4/22 or 4/23

## 2018-08-11 NOTE — Telephone Encounter (Signed)
-----   Message from Elouise Munroe, MD sent at 08/11/2018  8:13 AM EDT ----- No ischemia seen on stress test. Low exercise capacity, we should arrange a telehealth video visit follow up next week to discuss exercise recommendations.

## 2018-08-11 NOTE — Telephone Encounter (Signed)
New Message  Patient returning your call, please give her a call back.

## 2018-08-12 ENCOUNTER — Encounter: Payer: Self-pay | Admitting: *Deleted

## 2018-08-12 NOTE — Telephone Encounter (Signed)
Left message on both numbers  Home and cell phone to call back - need give results and  Make an appointment with Dr Margaretann Loveless.

## 2018-08-12 NOTE — Telephone Encounter (Signed)
Pt aware of myoview results and virtual visit scheduled for 08-19-18 at 8:40 am with Dr Margaretann Loveless .Adonis Housekeeper

## 2018-08-12 NOTE — Telephone Encounter (Signed)
Follow up    Patient is returning call in reference to stress test results. She is also requesting a copy of her last AVS to be sent to her so she can turn in for [facility] and it needs to have a signature.  Please call.

## 2018-08-12 NOTE — Telephone Encounter (Signed)
° ° ° °  Please call patient with stress test results

## 2018-08-19 ENCOUNTER — Telehealth (INDEPENDENT_AMBULATORY_CARE_PROVIDER_SITE_OTHER): Payer: BLUE CROSS/BLUE SHIELD | Admitting: Internal Medicine

## 2018-08-19 ENCOUNTER — Telehealth: Payer: Self-pay | Admitting: Internal Medicine

## 2018-08-19 ENCOUNTER — Encounter: Payer: Self-pay | Admitting: *Deleted

## 2018-08-19 VITALS — Ht 66.0 in | Wt 207.0 lb

## 2018-08-19 DIAGNOSIS — N183 Chronic kidney disease, stage 3 unspecified: Secondary | ICD-10-CM

## 2018-08-19 DIAGNOSIS — R079 Chest pain, unspecified: Secondary | ICD-10-CM

## 2018-08-19 DIAGNOSIS — I10 Essential (primary) hypertension: Secondary | ICD-10-CM

## 2018-08-19 MED ORDER — HYDRALAZINE HCL 25 MG PO TABS: 25.0000 mg | ORAL_TABLET | Freq: Two times a day (BID) | ORAL | 1 refills | Status: DC

## 2018-08-19 NOTE — Progress Notes (Signed)
Virtual Visit via Video Note   This visit type was conducted due to national recommendations for restrictions regarding the COVID-19 Pandemic (e.g. social distancing) in an effort to limit this patient's exposure and mitigate transmission in our community.  Due to her co-morbid illnesses, this patient is at least at moderate risk for complications without adequate follow up.  This format is felt to be most appropriate for this patient at this time.  All issues noted in this document were discussed and addressed.  A limited physical exam was performed with this format.  Please refer to the patient's chart for her consent to telehealth for [facility]   Evaluation Performed:  Follow-up visit  Date:  08/19/2018   ID:  Jamie Payne, Jamie Payne 07-03-1950, MRN [id number]  Patient Location: Home Provider Location: Home  PCP:  Antony Contras, MD  Cardiologist:  No primary care provider on file.  Electrophysiologist:  None   Chief Complaint:  F/u testing for chest pain; HTN  History of Present Illness:    Jamie Payne is a 68 y.o. female with a hx of glaucoma, HTN, DM, and family history of heart disease.   We discussed the results of her testing today. Echo showed normal LV function, moderate LVH and high LV filling pressure. Stress test showed poor exercise tolerance, early peak target heart rate, and no ischemia. She overall feels well, mild continued symptoms of atypical chest pain. No SOB, palpitations, PND, orthopnea or leg swelling. No syncope.  The patient does not have symptoms concerning for COVID-19 infection (fever, chills, cough, or new shortness of breath).    Past Medical History:  Diagnosis Date  . Glaucoma   . Hypertension   . Low hemoglobin    Past Surgical History:  Procedure Laterality Date  . BILATERAL SALPINGECTOMY    . BREAST BIOPSY Left 01/02/2017   negative  . COLONOSCOPY N/A 12/04/2013   Procedure: COLONOSCOPY;  Surgeon: Missy Sabins, MD;  Location: [facility];  Service: Endoscopy;  Laterality: N/A;  . ESOPHAGOGASTRODUODENOSCOPY N/A 12/05/2013   Procedure: ESOPHAGOGASTRODUODENOSCOPY (EGD);  Surgeon: Jeryl Columbia, MD;  Location: [facility] ENDOSCOPY;  Service: Endoscopy;  Laterality: N/A;  . HERNIA REPAIR       Current Meds  Medication Sig  . amLODipine (NORVASC) 10 MG tablet Take 10 mg by mouth daily.  . cloNIDine (CATAPRES) 0.1 MG tablet Take 0.1 mg by mouth 2 (two) times daily.  . dorzolamide-timolol (COSOPT) 22.3-6.8 MG/ML ophthalmic solution Place 1 drop into the left eye 2 (two) times daily.  . insulin glargine (LANTUS) 100 UNIT/ML injection Inject 0.1 mLs (10 Units total) into the skin daily.  . meloxicam (MOBIC) 15 MG tablet Take 15 mg by mouth daily as needed for pain.     Allergies:   Lisinopril; Penicillins; and Influenza vaccine live   Social History   Tobacco Use  . Smoking status: Never Smoker  . Smokeless tobacco: Never Used  Substance Use Topics  . Alcohol use: No  . Drug use: No     Family Hx: The patient's family history includes Bipolar disorder in her daughter and daughter; Breast cancer in her maternal grandmother and paternal grandmother; Diabetes in her mother; Hypertension in her father and mother; Pancreatic cancer in her brother and brother; Renal Disease in her father.  ROS:   Please see the history of present illness.     All other systems reviewed and are negative.   Prior CV studies:   The following studies were  reviewed today:  Echo, Nuclear stress test  Labs/Other Tests and Data Reviewed:    EKG:  No ECG reviewed.  Recent Labs: 05/22/2018: BUN 23; Creatinine, Ser 1.53; Hemoglobin 12.9; Platelets 224; Potassium 3.7; Sodium 131   Recent Lipid Panel No results found for: CHOL, TRIG, HDL, CHOLHDL, LDLCALC, LDLDIRECT  Wt Readings from Last 3 Encounters:  08/19/18 207 lb (93.9 kg)  08/06/18 206 lb (93.4 kg)  07/14/18 206 lb (93.4 kg)     Objective:    Vital Signs:  Ht 5\' 6"  (1.676 m)   Wt  207 lb (93.9 kg)   BMI 33.41 kg/m    VITAL SIGNS:  reviewed GEN:  no acute distress EYES:  sclerae anicteric, EOMI - Extraocular Movements Intact RESPIRATORY:  normal respiratory effort, symmetric expansion CARDIOVASCULAR:  no peripheral edema SKIN:  no rash, lesions or ulcers. MUSCULOSKELETAL:  no obvious deformities. NEURO:  alert and oriented x 3, no obvious focal deficit PSYCH:  normal affect  ASSESSMENT & PLAN:    1. Chest pain, unspecified type   2. Hypertension, unspecified type   3. Stage 3 chronic kidney disease (HCC)    Chest pain - atypical in nature, with normal stress test. Evidence of elevated filling pressure in LV and moderate LVH. We can consider low dose lasix when patient is able to come in for repeat labs and monitoring, for diastolic dysfunction, which may be contributing to chest discomfort.   HTN - echo and vital signs suggest hypertension. We will start hydralazine 25 mg BID given renal dysfunction. We will likely transition to spironolactone or alternate therapy at next visit. Patient agrees and understands the plan.  COVID-19 Education: The signs and symptoms of COVID-19 were discussed with the patient and how to seek care for testing (follow up with PCP or arrange E-visit). The importance of social distancing was discussed today.  Time:   Today, I have spent 19 minutes with the patient with telehealth technology discussing the above problems.     Medication Adjustments/Labs and Tests Ordered: Current medicines are reviewed at length with the patient today.  Concerns regarding medicines are outlined above.   Tests Ordered: No orders of the defined types were placed in this encounter.   Medication Changes: Meds ordered this encounter  Medications  . hydrALAZINE (APRESOLINE) 25 MG tablet    Sig: Take 1 tablet (25 mg total) by mouth 2 (two) times a day.    Dispense:  180 tablet    Refill:  1    Disposition:  Follow up in 3 month(s)  Signed,  Elouise Munroe, MD  08/19/2018 2:49 PM    [facility]  Medication Instructions:  START HYDRALAZINE 25 MG TWICE A DAY  If you need a refill on your cardiac medications before your next appointment, please call your pharmacy.   Lab work: NONE  Testing/Procedures: NONE  Follow-Up: Your physician recommends that you schedule a follow-up appointment in: 3 MONTHS 11/19/18 AT 8:40 AM

## 2018-08-19 NOTE — Telephone Encounter (Signed)
Patient returned your call.

## 2018-08-19 NOTE — Patient Instructions (Signed)
Medication Instructions:  START HYDRALAZINE 25 MG TWICE A DAY  If you need a refill on your cardiac medications before your next appointment, please call your pharmacy.   Lab work: NONE  Testing/Procedures: NONE  Follow-Up: Your physician recommends that you schedule a follow-up appointment in: 3 MONTHS 11/19/18 AT 8:40 AM

## 2018-08-19 NOTE — Telephone Encounter (Signed)
Spoke with patient and advised would mail letter for proof of visit today.

## 2018-08-28 DIAGNOSIS — N183 Chronic kidney disease, stage 3 (moderate): Secondary | ICD-10-CM | POA: Diagnosis not present

## 2018-08-28 DIAGNOSIS — I10 Essential (primary) hypertension: Secondary | ICD-10-CM | POA: Diagnosis not present

## 2018-08-28 DIAGNOSIS — E782 Mixed hyperlipidemia: Secondary | ICD-10-CM | POA: Diagnosis not present

## 2018-08-28 DIAGNOSIS — E1169 Type 2 diabetes mellitus with other specified complication: Secondary | ICD-10-CM | POA: Diagnosis not present

## 2018-09-12 DIAGNOSIS — N183 Chronic kidney disease, stage 3 (moderate): Secondary | ICD-10-CM | POA: Diagnosis not present

## 2018-09-12 DIAGNOSIS — Z961 Presence of intraocular lens: Secondary | ICD-10-CM | POA: Diagnosis not present

## 2018-09-12 DIAGNOSIS — E1169 Type 2 diabetes mellitus with other specified complication: Secondary | ICD-10-CM | POA: Diagnosis not present

## 2018-09-12 DIAGNOSIS — H401133 Primary open-angle glaucoma, bilateral, severe stage: Secondary | ICD-10-CM | POA: Diagnosis not present

## 2018-11-18 ENCOUNTER — Telehealth: Payer: Self-pay | Admitting: Internal Medicine

## 2018-11-18 NOTE — Telephone Encounter (Signed)

## 2018-11-19 ENCOUNTER — Telehealth (INDEPENDENT_AMBULATORY_CARE_PROVIDER_SITE_OTHER): Payer: BC Managed Care – PPO | Admitting: Internal Medicine

## 2018-11-19 ENCOUNTER — Encounter: Payer: Self-pay | Admitting: Internal Medicine

## 2018-11-19 VITALS — BP 124/87 | HR 82 | Ht 66.0 in | Wt 200.0 lb

## 2018-11-19 DIAGNOSIS — N183 Chronic kidney disease, stage 3 unspecified: Secondary | ICD-10-CM

## 2018-11-19 DIAGNOSIS — I10 Essential (primary) hypertension: Secondary | ICD-10-CM

## 2018-11-19 DIAGNOSIS — Z01812 Encounter for preprocedural laboratory examination: Secondary | ICD-10-CM | POA: Diagnosis not present

## 2018-11-19 DIAGNOSIS — Z79899 Other long term (current) drug therapy: Secondary | ICD-10-CM | POA: Diagnosis not present

## 2018-11-19 DIAGNOSIS — R072 Precordial pain: Secondary | ICD-10-CM | POA: Diagnosis not present

## 2018-11-19 MED ORDER — METOPROLOL TARTRATE 50 MG PO TABS: ORAL_TABLET | ORAL | 0 refills | Status: DC

## 2018-11-19 NOTE — Progress Notes (Signed)
Virtual Visit via Telephone Note   This visit type was conducted due to national recommendations for restrictions regarding the COVID-19 Pandemic (e.g. social distancing) in an effort to limit this patient's exposure and mitigate transmission in our community.  Due to her co-morbid illnesses, this patient is at least at moderate risk for complications without adequate follow up.  This format is felt to be most appropriate for this patient at this time.  All issues noted in this document were discussed and addressed.  A limited physical exam was performed with this format.  Please refer to the patient's chart for her consent to telehealth for [facility].   Date:  11/19/2018   ID:  Jamie Payne, DOB Nov 09, 1950, MRN [id number]  Patient Location: Home Provider Location: Home  PCP:  Antony Contras, MD  Cardiologist:  No primary care provider on file.  Electrophysiologist:  None   Evaluation Performed:  Follow-Up Visit  Chief Complaint:  F/u chest pain  History of Present Illness:    Jamie Payne is a 68 y.o. female with a hx of glaucoma, HTN, DM, and family history of heart disease.   She has overall been feeling well but endorses an episode of chest pain that occurred a couple of weeks ago when she tried to climb a hill that she does not normally climb.  It was a brief episode of substernal chest pain, that resolved in seconds to minutes.  She has otherwise been feeling well.  Her primary care physician placed her on Crestor 5 mg daily for cardiovascular risk reduction in the setting of diabetes, however she did not tolerate this medication given weakness and fatigue.  She self discontinued.  The patient denies dyspnea at rest or with exertion, palpitations, PND, orthopnea, or leg swelling. Denies syncope or presyncope. Denies dizziness or lightheadedness.   The patient does not have symptoms concerning for COVID-19 infection (fever, chills, cough, or new shortness of breath).     Past Medical History:  Diagnosis Date  . Glaucoma   . Hypertension   . Low hemoglobin    Past Surgical History:  Procedure Laterality Date  . BILATERAL SALPINGECTOMY    . BREAST BIOPSY Left 01/02/2017   negative  . COLONOSCOPY N/A 12/04/2013   Procedure: COLONOSCOPY;  Surgeon: Missy Sabins, MD;  Location: [place];  Service: Endoscopy;  Laterality: N/A;  . ESOPHAGOGASTRODUODENOSCOPY N/A 12/05/2013   Procedure: ESOPHAGOGASTRODUODENOSCOPY (EGD);  Surgeon: Jeryl Columbia, MD;  Location: [facility] ENDOSCOPY;  Service: Endoscopy;  Laterality: N/A;  . HERNIA REPAIR       Current Meds  Medication Sig  . amLODipine (NORVASC) 10 MG tablet Take 10 mg by mouth daily.  . cloNIDine (CATAPRES) 0.1 MG tablet Take 0.1 mg by mouth 2 (two) times daily.  . dorzolamide-timolol (COSOPT) 22.3-6.8 MG/ML ophthalmic solution Place 1 drop into the left eye 2 (two) times daily.  . hydrALAZINE (APRESOLINE) 25 MG tablet Take 1 tablet (25 mg total) by mouth 2 (two) times a day.  . insulin glargine (LANTUS) 100 UNIT/ML injection Inject 0.1 mLs (10 Units total) into the skin daily.  . meloxicam (MOBIC) 15 MG tablet Take 15 mg by mouth daily as needed for pain.      Allergies:   Lisinopril, Penicillins, Crestor [rosuvastatin calcium], and Influenza vaccine live   Social History   Tobacco Use  . Smoking status: Never Smoker  . Smokeless tobacco: Never Used  Substance Use Topics  . Alcohol use: No  . Drug use:  No     Family Hx: The patient's family history includes Bipolar disorder in her daughter and daughter; Breast cancer in her maternal grandmother and paternal grandmother; Diabetes in her mother; Hypertension in her father and mother; Pancreatic cancer in her brother and brother; Renal Disease in her father.  ROS:   Please see the history of present illness.     All other systems reviewed and are negative.   Prior CV studies:   The following studies were reviewed today:  Nuc stress, echo  Labs/Other  Tests and Data Reviewed:    EKG:  No ECG reviewed.  Recent Labs: 05/22/2018: BUN 23; Creatinine, Ser 1.53; Hemoglobin 12.9; Platelets 224; Potassium 3.7; Sodium 131   Recent Lipid Panel No results found for: CHOL, TRIG, HDL, CHOLHDL, LDLCALC, LDLDIRECT  Wt Readings from Last 3 Encounters:  11/19/18 200 lb (90.7 kg)  08/19/18 207 lb (93.9 kg)  08/06/18 206 lb (93.4 kg)     Objective:    Vital Signs:  BP 124/87   Pulse 82   Ht 5\' 6"  (1.676 m)   Wt 200 lb (90.7 kg)   BMI 32.28 kg/m    VITAL SIGNS:  reviewed GEN:  no acute distress  ASSESSMENT & PLAN:    1. Precordial pain   2. Pre-procedure lab exam   3. Medication management   4. Stage 3 chronic kidney disease ([place])   5. Essential hypertension    Chest pain with exertion- I remain concerned about her episodes of chest pain which have persisted since we performed a stress test.  The stress test was at a low workload, which raises concern for potential false negative results.  She would do well to have a CT coronary angiogram now that COVID restrictions have been lifted for that test.  Given her diabetes mellitus, hypertension and kidney disease, her risk factors are present and need to be evaluated in the setting of chest pain.  She is agreeable to completing this test.  We will provide 50 mg of metoprolol prior to for heart rate optimization.  Hypertension- blood pressure has been well controlled with the addition of hydralazine 25 mg twice daily.  We will need to further optimize her medical therapy once results of CT scan are available to determine goal-directed medical therapy in the setting of chronic kidney disease.  Cardiovascular risk factors- she has not been able to tolerate Crestor 5 mg daily.  We will obtain a lipid panel and with this information in addition to the CT scan for calcium burden and plaque burden, we will make decisions about next best steps for cardiovascular risk reduction with lipid therapy options.   DM 2- per PCP.  Consider novel therapies for diabetes and CAD pending results of CT scan.  COVID-19 Education: The signs and symptoms of COVID-19 were discussed with the patient and how to seek care for testing (follow up with PCP or arrange E-visit).  The importance of social distancing was discussed today.  Time:   Today, I have spent 25 minutes with the patient with telehealth technology discussing the above problems.     Medication Adjustments/Labs and Tests Ordered: Current medicines are reviewed at length with the patient today.  Concerns regarding medicines are outlined above.   Tests Ordered: Orders Placed This Encounter  Procedures  . CT CORONARY MORPH W/CTA COR W/SCORE W/CA W/CM &/OR WO/CM  . CT CORONARY FRACTIONAL FLOW RESERVE DATA PREP  . CT CORONARY FRACTIONAL FLOW RESERVE FLUID ANALYSIS  . Lipid panel  .  Basic metabolic panel    Medication Changes: Meds ordered this encounter  Medications  . metoprolol tartrate (LOPRESSOR) 50 MG tablet    Sig: TAKE 1 TABLET 2 HR PRIOR TO CARDIAC PROCEDURE    Dispense:  1 tablet    Refill:  0    Follow Up:  Virtual Visit or In Person in 1 month(s)  Signed, Elouise Munroe, MD  11/19/2018 10:49 AM    [place]

## 2018-11-19 NOTE — Patient Instructions (Addendum)
Medication Instructions:  Your Physician recommend you continue on your current medication as directed.    If you need a refill on your cardiac medications before your next appointment, please call your pharmacy.   Lab work: Your physician recommends that you return for lab work 1 week prior to procedure. (BMP, Lipid)  If you have labs (blood work) drawn today and your tests are completely normal, you will receive your results only by: Marland Kitchen MyChart Message (if you have MyChart) OR . A paper copy in the mail If you have any lab test that is abnormal or we need to change your treatment, we will call you to review the results.  Testing/Procedures: Your physician has requested that you have cardiac CT. Cardiac computed tomography (CT) is a painless test that uses an x-ray machine to take clear, detailed pictures of your heart. For further information please visit [email or web address]. Please follow instruction sheet as given. [name]    Follow-Up: Your physician recommends that you schedule a follow-up appointment in 1 month with an APP   Please arrive at the [facility] main entrance of [facility] at xx:xx AM (30-45 minutes prior to test start time)  [facility] [place], [place] [address] [phone]  Proceed to the [facility] Department (First Floor).  Please follow these instructions carefully (unless otherwise directed):  Hold all erectile dysfunction medications at least 48 hours prior to test.  On the Night Before the Test: . Be sure to Drink plenty of water. . Do not consume any caffeinated/decaffeinated beverages or chocolate 12 hours prior to your test. . Do not take any antihistamines 12 hours prior to your test. . If you take Metformin do not take 24 hours prior to test.   On the Day of the Test: . Drink plenty of water. Do not drink any water within one hour of the test. . Do not eat any food 4 hours prior to the  test. . You may take your regular medications prior to the test.  . Take metoprolol (Lopressor) two hours prior to test. . HOLD Furosemide/Hydrochlorothiazide morning of the test.        After the Test: . Drink plenty of water. . After receiving IV contrast, you may experience a mild flushed feeling. This is normal. . On occasion, you may experience a mild rash up to 24 hours after the test. This is not dangerous. If this occurs, you can take Benadryl 25 mg and increase your fluid intake. . If you experience trouble breathing, this can be serious. If it is severe call 911 IMMEDIATELY. If it is mild, please call our office. . If you take any of these medications: Glipizide/Metformin, Avandament, Glucavance, please do not take 48 hours after completing test.

## 2018-11-25 DIAGNOSIS — E782 Mixed hyperlipidemia: Secondary | ICD-10-CM | POA: Diagnosis not present

## 2018-11-25 DIAGNOSIS — I10 Essential (primary) hypertension: Secondary | ICD-10-CM | POA: Diagnosis not present

## 2018-11-25 DIAGNOSIS — N183 Chronic kidney disease, stage 3 (moderate): Secondary | ICD-10-CM | POA: Diagnosis not present

## 2018-11-25 DIAGNOSIS — E1169 Type 2 diabetes mellitus with other specified complication: Secondary | ICD-10-CM | POA: Diagnosis not present

## 2018-11-28 DIAGNOSIS — Z79899 Other long term (current) drug therapy: Secondary | ICD-10-CM | POA: Diagnosis not present

## 2018-11-28 DIAGNOSIS — Z01812 Encounter for preprocedural laboratory examination: Secondary | ICD-10-CM | POA: Diagnosis not present

## 2018-11-28 LAB — BASIC METABOLIC PANEL
BUN/Creatinine Ratio: 13 (ref 12–28)
BUN: 20 mg/dL (ref 8–27)
CO2: 21 mmol/L (ref 20–29)
Calcium: 10.4 mg/dL — ABNORMAL HIGH (ref 8.7–10.3)
Chloride: 105 mmol/L (ref 96–106)
Creatinine, Ser: 1.49 mg/dL — ABNORMAL HIGH (ref 0.57–1.00)
GFR calc Af Amer: 41 mL/min/{1.73_m2} — ABNORMAL LOW (ref >59)
GFR calc non Af Amer: 35 mL/min/{1.73_m2} — ABNORMAL LOW (ref >59)
Glucose: 101 mg/dL — ABNORMAL HIGH (ref 65–99)
Potassium: 4.3 mmol/L (ref 3.5–5.2)
Sodium: 142 mmol/L (ref 134–144)

## 2018-11-28 LAB — LIPID PANEL
Chol/HDL Ratio: 4.7 ratio — ABNORMAL HIGH (ref 0.0–4.4)
Cholesterol, Total: 186 mg/dL (ref 100–199)
HDL: 40 mg/dL (ref >39)
LDL Calculated: 107 mg/dL — ABNORMAL HIGH (ref 0–99)
Triglycerides: 196 mg/dL — ABNORMAL HIGH (ref 0–149)
VLDL Cholesterol Cal: 39 mg/dL (ref 5–40)

## 2018-12-02 ENCOUNTER — Other Ambulatory Visit (HOSPITAL_COMMUNITY): Payer: Self-pay | Admitting: Emergency Medicine

## 2018-12-02 DIAGNOSIS — N183 Chronic kidney disease, stage 3 unspecified: Secondary | ICD-10-CM

## 2018-12-03 ENCOUNTER — Telehealth (HOSPITAL_COMMUNITY): Payer: Self-pay | Admitting: Emergency Medicine

## 2018-12-03 NOTE — Telephone Encounter (Signed)
Reaching out to patient to offer assistance regarding upcoming cardiac imaging study; pt verbalizes understanding of appt date/time, parking situation and where to check in, pre-test NPO status and medications ordered, and verified current allergies; name and call back number provided for further questions should they arise Jamie Bond RN Navigator Cardiac Imaging [facility] [phone] office [phone] cell  Pt informed of protocol for bicarb infusion pre-/post- cardiac CT. Also informed of her appt made for medical day at 8am for procedure. Pt verbalized understanding. Pt is to take metoprolol at 9:30a while in medical day.  Pt denies covid symptoms, verbalized understanding of visitor policy.

## 2018-12-04 ENCOUNTER — Other Ambulatory Visit: Payer: Self-pay

## 2018-12-04 ENCOUNTER — Ambulatory Visit (HOSPITAL_COMMUNITY): Payer: BC Managed Care – PPO

## 2018-12-04 ENCOUNTER — Ambulatory Visit (HOSPITAL_COMMUNITY)
Admission: RE | Admit: 2018-12-04 | Discharge: 2018-12-04 | Disposition: A | Discharge status: Home / Self Care | Payer: BC Managed Care – PPO | Source: Ambulatory Visit | Attending: Cardiology | Admitting: Cardiology

## 2018-12-04 ENCOUNTER — Ambulatory Visit (HOSPITAL_COMMUNITY): Admission: RE | Admit: 2018-12-04 | Payer: BC Managed Care – PPO | Source: Ambulatory Visit

## 2018-12-04 ENCOUNTER — Encounter (HOSPITAL_COMMUNITY): Payer: Self-pay

## 2018-12-09 ENCOUNTER — Telehealth (HOSPITAL_COMMUNITY): Payer: Self-pay | Admitting: Emergency Medicine

## 2018-12-09 NOTE — Telephone Encounter (Signed)
Left message on voicemail with name and callback number Johnthan Axtman RN Navigator Cardiac Imaging [facility] [phone] Office [phone] Cell  

## 2018-12-10 ENCOUNTER — Ambulatory Visit (HOSPITAL_COMMUNITY)
Admission: RE | Admit: 2018-12-10 | Discharge: 2018-12-10 | Disposition: A | Discharge status: Home / Self Care | Payer: BC Managed Care – PPO | Source: Ambulatory Visit | Attending: Internal Medicine | Admitting: Internal Medicine

## 2018-12-10 ENCOUNTER — Ambulatory Visit (HOSPITAL_COMMUNITY): Payer: BC Managed Care – PPO

## 2018-12-10 ENCOUNTER — Other Ambulatory Visit: Payer: Self-pay

## 2018-12-10 ENCOUNTER — Telehealth: Payer: Self-pay

## 2018-12-10 DIAGNOSIS — R072 Precordial pain: Secondary | ICD-10-CM | POA: Insufficient documentation

## 2018-12-10 DIAGNOSIS — N183 Chronic kidney disease, stage 3 unspecified: Secondary | ICD-10-CM

## 2018-12-10 LAB — BASIC METABOLIC PANEL
Anion gap: 11 (ref 5–15)
BUN: 16 mg/dL (ref 8–23)
CO2: 22 mmol/L (ref 22–32)
Calcium: 9.8 mg/dL (ref 8.9–10.3)
Chloride: 108 mmol/L (ref 98–111)
Creatinine, Ser: 1.47 mg/dL — ABNORMAL HIGH (ref 0.44–1.00)
GFR calc Af Amer: 41 mL/min — ABNORMAL LOW (ref >60)
GFR calc non Af Amer: 36 mL/min — ABNORMAL LOW (ref >60)
Glucose, Bld: 118 mg/dL — ABNORMAL HIGH (ref 70–99)
Potassium: 3.9 mmol/L (ref 3.5–5.1)
Sodium: 141 mmol/L (ref 135–145)

## 2018-12-10 MED ORDER — NITROGLYCERIN 0.4 MG SL SUBL
SUBLINGUAL_TABLET | SUBLINGUAL | Status: AC
  Filled 2018-12-10: qty 2

## 2018-12-10 MED ORDER — SODIUM BICARBONATE 8.4 % IV SOLN
INTRAVENOUS | Status: DC
  Filled 2018-12-10: qty 500

## 2018-12-10 MED ORDER — SODIUM BICARBONATE BOLUS VIA INFUSION
INTRAVENOUS | Status: AC
  Administered 2018-12-10: 09:00:00 75 meq via INTRAVENOUS
  Filled 2018-12-10: qty 1

## 2018-12-10 MED ORDER — IOHEXOL 350 MG/ML SOLN
80.0000 mL | Freq: Once | INTRAVENOUS | Status: AC | PRN
  Administered 2018-12-10: 13:00:00 80 mL via INTRAVENOUS

## 2018-12-10 NOTE — Telephone Encounter (Signed)
LMTCB

## 2018-12-10 NOTE — Telephone Encounter (Signed)
-----   Message from Sueanne Margarita, MD sent at 12/10/2018 10:15 AM EDT ----- Stable labs - continue current meds and forward to PCP

## 2018-12-16 ENCOUNTER — Ambulatory Visit: Payer: BC Managed Care – PPO | Admitting: Adult Health

## 2018-12-24 NOTE — Progress Notes (Signed)
Virtual Visit via Video Note   This visit type was conducted due to national recommendations for restrictions regarding the COVID-19 Pandemic (e.g. social distancing) in an effort to limit this patient's exposure and mitigate transmission in our community.  Due to her co-morbid illnesses, this patient is at least at moderate risk for complications without adequate follow up.  This format is felt to be most appropriate for this patient at this time.  All issues noted in this document were discussed and addressed.  A limited physical exam was performed with this format.  Please refer to the patient's chart for her consent to telehealth for [facility].   Date:  12/25/2018   ID:  Jamie Payne, DOB 02/03/1951, MRN [id number]  Patient Location: Home Provider Location: Home  PCP:  Antony Contras, MD  Cardiologist:  Dr. Margaretann Loveless  Electrophysiologist:  None   Evaluation Performed:  Follow-Up Visit  Chief Complaint: No complaints  History of Present Illness:    Jamie Payne is a 68 y.o. female with we are following for ongoing assessment and management of hypertension, chest pain, and hyperlipidemia.  Other history includes diabetes, glaucoma, and family history of CAD.  She was seen last in the office on 11/19/2018, with continued complaints of chest pain despite having normal stress test.  She was scheduled for coronary CTA due to multiple cardiovascular risk factors.    Coronary CT revealed mild nonobstructive (1 to 24%) mixed plaque of the proximal and distal LAD.  Coronary calcium score of 5.87 which was 20 percentile for age and sex match control.  Normal coronary origin with right dominance, there was some aortic annular calcification.  [name] is without complaint today.  She is medically compliant and has no complaints of chest pai. dyspnea on exertion, fatigue or palpitations.  She is being seen today to discuss her test results above.   The patient does not have symptoms  concerning for COVID-19 infection (fever, chills, cough, or new shortness of breath).    Past Medical History:  Diagnosis Date  . Glaucoma   . Hypertension   . Low hemoglobin    Past Surgical History:  Procedure Laterality Date  . BILATERAL SALPINGECTOMY    . BREAST BIOPSY Left 01/02/2017   negative  . COLONOSCOPY N/A 12/04/2013   Procedure: COLONOSCOPY;  Surgeon: Missy Sabins, MD;  Location: [place];  Service: Endoscopy;  Laterality: N/A;  . ESOPHAGOGASTRODUODENOSCOPY N/A 12/05/2013   Procedure: ESOPHAGOGASTRODUODENOSCOPY (EGD);  Surgeon: Jeryl Columbia, MD;  Location[facility] ENDOSCOPY;  Service: Endoscopy;  Laterality: N/A;  . HERNIA REPAIR       Current Meds  Medication Sig  . amLODipine (NORVASC) 10 MG tablet Take 10 mg by mouth daily.  . cloNIDine (CATAPRES) 0.2 MG tablet Take 1 tablet by mouth daily.  . dorzolamide-timolol (COSOPT) 22.3-6.8 MG/ML ophthalmic solution Place 1 drop into the left eye 2 (two) times daily.  . hydrALAZINE (APRESOLINE) 25 MG tablet Take 1 tablet (25 mg total) by mouth 2 (two) times a day.  . meloxicam (MOBIC) 15 MG tablet Take 15 mg by mouth daily as needed for pain.   . rosuvastatin (CRESTOR) 5 MG tablet Take 5 mg by mouth daily.  . TOUJEO SOLOSTAR 300 UNIT/ML SOPN INJECT 28 UNITS ONCE A DAY  . [DISCONTINUED] cloNIDine (CATAPRES) 0.1 MG tablet Take 0.1 mg by mouth 2 (two) times daily.  . [DISCONTINUED] insulin glargine (LANTUS) 100 UNIT/ML injection Inject 0.1 mLs (10 Units total) into the skin daily. (  Patient taking differently: Inject 28 Units into the skin daily. )     Allergies:   Lisinopril, Penicillins, Crestor [rosuvastatin calcium], and Influenza vaccine live   Social History   Tobacco Use  . Smoking status: Never Smoker  . Smokeless tobacco: Never Used  Substance Use Topics  . Alcohol use: No  . Drug use: No     Family Hx: The patient's family history includes Bipolar disorder in her daughter and daughter; Breast cancer in her  maternal grandmother and paternal grandmother; Diabetes in her mother; Hypertension in her father and mother; Pancreatic cancer in her brother and brother; Renal Disease in her father.  ROS:   Please see the history of present illness.    All other systems reviewed and are negative.   Prior CV studies:   The following studies were reviewed today:  Echocardiogram 08/06/2018 1. The left ventricle has hyperdynamic systolic function, with an ejection fraction of >65%. The cavity size was normal. There is moderate concentric left ventricular hypertrophy. Left ventricular diastolic Doppler parameters are consistent with  impaired relaxation. Elevated left ventricular end-diastolic pressure The E/e' is 15.7.  2. The right ventricle has normal systolic function. The cavity was normal. There is no increase in right ventricular wall thickness.  3. No hemodynamically significant valve disease.  4. The ascending aorta is normal in size and structure.  5. The inferior vena cava was normal in size with <50% respiratory variability.  Coronary CTA 12/10/2018 IMPRESSION: 1. Mild non-obstructive (1-24%) mixed plaque of the proximal and distal LAD, CADRADS = 1.  2. Coronary calcium score of 5.87. This was 59th percentile for age and sex matched control.  3. Normal coronary origin with right dominance.  4. Aortic annular calcification.   Labs/Other Tests and Data Reviewed:    EKG:  No ECG reviewed.  Recent Labs: 05/22/2018: Hemoglobin 12.9; Platelets 224 12/10/2018: BUN 16; Creatinine, Ser 1.47; Potassium 3.9; Sodium 141   Recent Lipid Panel Lab Results  Component Value Date/Time   CHOL 186 11/28/2018 08:49 AM   TRIG 196 (H) 11/28/2018 08:49 AM   HDL 40 11/28/2018 08:49 AM   CHOLHDL 4.7 (H) 11/28/2018 08:49 AM   LDLCALC 107 (H) 11/28/2018 08:49 AM    Wt Readings from Last 3 Encounters:  12/25/18 200 lb (90.7 kg)  12/10/18 200 lb (90.7 kg)  11/19/18 200 lb (90.7 kg)     Objective:     Vital Signs:  BP (!) 148/102   Pulse 78   Ht 5\' 6"  (1.676 m)   Wt 200 lb (90.7 kg)   BMI 32.28 kg/m    Limited due to to tele-visit. VITAL SIGNS:  reviewed GEN:  no acute distress PSYCH:  normal affect  ASSESSMENT & PLAN:    1.  Chest pain: No further complaints of chest pain at this time.  Coronary CTA stress test and echocardiogram are reassuring.  I have given her the results of her tests, provided explanation, and she has verbalized understanding.  No additional cardiac testing will be planned unless she becomes symptomatic.  2.  Hypertension: Blood pressure is slightly elevated today.  She is just taken her medications this morning.  Blood pressure is normally well controlled on follow-up visits with other providers.  No changes in her current regimen.  Labs are completed by PCP.  I explained to her the need to keep blood pressure under control due to mild diastolic dysfunction found on echocardiogram and to reduce salt in her diet.  She verbalizes  understanding  3.  Diabetes: Followed by PCP.  Due to this cardiovascular risk factor, the patient will be followed by cardiology consistently at least once a year to monitor blood pressure and symptoms with need for further testing.  COVID-19 Education: The signs and symptoms of COVID-19 were discussed with the patient and how to seek care for testing (follow up with PCP or arrange E-visit).  The importance of social distancing was discussed today.  Time:   Today, I have spent 15 minutes with the patient with telehealth technology discussing the above problems.     Medication Adjustments/Labs and Tests Ordered: Current medicines are reviewed at length with the patient today.  Concerns regarding medicines are outlined above.   Tests Ordered: No orders of the defined types were placed in this encounter.   Medication Changes: No orders of the defined types were placed in this encounter.   Disposition:  Follow up 6 to 9 months   Signed, Phill Myron. West Pugh, ANP, AACC  12/25/2018 8:51 AM    [facility]

## 2018-12-25 ENCOUNTER — Telehealth (INDEPENDENT_AMBULATORY_CARE_PROVIDER_SITE_OTHER): Payer: BC Managed Care – PPO | Admitting: Adult Health

## 2018-12-25 ENCOUNTER — Encounter: Payer: Self-pay | Admitting: Adult Health

## 2018-12-25 VITALS — BP 148/102 | HR 78 | Ht 66.0 in | Wt 200.0 lb

## 2018-12-25 DIAGNOSIS — I10 Essential (primary) hypertension: Secondary | ICD-10-CM

## 2018-12-25 DIAGNOSIS — R079 Chest pain, unspecified: Secondary | ICD-10-CM

## 2018-12-25 DIAGNOSIS — E7800 Pure hypercholesterolemia, unspecified: Secondary | ICD-10-CM

## 2018-12-25 NOTE — Patient Instructions (Signed)
Medication Instructions:  Continue current medications  If you need a refill on your cardiac medications before your next appointment, please call your pharmacy.  Labwork: None Ordered   Testing/Procedures: None Ordered  Follow-Up: You will need a follow up appointment in 6 months.  Please call our office 2 months in advance to schedule this appointment.  You may see Dr Margaretann Loveless or one of the following Advanced Practice Providers on your designated Care Team:   Rosaria Ferries, PA-C . Jory Sims, DNP, ANP     At [facility], you and your health needs are our priority.  As part of our continuing mission to provide you with exceptional heart care, we have created designated Provider Care Teams.  These Care Teams include your primary Cardiologist (physician) and Advanced Practice Providers (APPs -  Physician Assistants and Nurse Practitioners) who all work together to provide you with the care you need, when you need it.  Thank you for choosing [facility] at [facility]!!

## 2019-01-15 DIAGNOSIS — D649 Anemia, unspecified: Secondary | ICD-10-CM | POA: Diagnosis not present

## 2019-01-15 DIAGNOSIS — E782 Mixed hyperlipidemia: Secondary | ICD-10-CM | POA: Diagnosis not present

## 2019-01-15 DIAGNOSIS — E1169 Type 2 diabetes mellitus with other specified complication: Secondary | ICD-10-CM | POA: Diagnosis not present

## 2019-01-15 DIAGNOSIS — I10 Essential (primary) hypertension: Secondary | ICD-10-CM | POA: Diagnosis not present

## 2019-01-18 ENCOUNTER — Other Ambulatory Visit: Payer: Self-pay | Admitting: Internal Medicine

## 2019-01-29 ENCOUNTER — Other Ambulatory Visit: Payer: Self-pay

## 2019-01-29 MED ORDER — HYDRALAZINE HCL 25 MG PO TABS: 25.0000 mg | ORAL_TABLET | Freq: Two times a day (BID) | ORAL | 3 refills | Status: DC

## 2019-01-29 NOTE — Telephone Encounter (Signed)
Rx(s) sent to pharmacy electronically.  

## 2019-02-10 ENCOUNTER — Other Ambulatory Visit: Payer: Self-pay | Admitting: Family Medicine

## 2019-02-10 DIAGNOSIS — Z1231 Encounter for screening mammogram for malignant neoplasm of breast: Secondary | ICD-10-CM

## 2019-03-03 DIAGNOSIS — H401133 Primary open-angle glaucoma, bilateral, severe stage: Secondary | ICD-10-CM | POA: Diagnosis not present

## 2019-03-03 DIAGNOSIS — H35031 Hypertensive retinopathy, right eye: Secondary | ICD-10-CM | POA: Diagnosis not present

## 2019-03-03 DIAGNOSIS — E119 Type 2 diabetes mellitus without complications: Secondary | ICD-10-CM | POA: Diagnosis not present

## 2019-03-03 DIAGNOSIS — Z961 Presence of intraocular lens: Secondary | ICD-10-CM | POA: Diagnosis not present

## 2019-04-02 ENCOUNTER — Other Ambulatory Visit: Payer: Self-pay

## 2019-04-02 ENCOUNTER — Ambulatory Visit
Admission: RE | Admit: 2019-04-02 | Discharge: 2019-04-02 | Disposition: A | Discharge status: Home / Self Care | Payer: BC Managed Care – PPO | Source: Ambulatory Visit | Attending: Family Medicine | Admitting: Family Medicine

## 2019-04-02 DIAGNOSIS — Z1231 Encounter for screening mammogram for malignant neoplasm of breast: Secondary | ICD-10-CM

## 2019-04-06 ENCOUNTER — Other Ambulatory Visit: Payer: Self-pay | Admitting: Family Medicine

## 2019-04-06 DIAGNOSIS — R928 Other abnormal and inconclusive findings on diagnostic imaging of breast: Secondary | ICD-10-CM

## 2019-04-15 DIAGNOSIS — Z961 Presence of intraocular lens: Secondary | ICD-10-CM | POA: Diagnosis not present

## 2019-04-15 DIAGNOSIS — H401133 Primary open-angle glaucoma, bilateral, severe stage: Secondary | ICD-10-CM | POA: Diagnosis not present

## 2019-04-15 DIAGNOSIS — H35031 Hypertensive retinopathy, right eye: Secondary | ICD-10-CM | POA: Diagnosis not present

## 2019-04-15 DIAGNOSIS — E119 Type 2 diabetes mellitus without complications: Secondary | ICD-10-CM | POA: Diagnosis not present

## 2019-05-28 ENCOUNTER — Ambulatory Visit
Admission: RE | Admit: 2019-05-28 | Discharge: 2019-05-28 | Disposition: A | Discharge status: Home / Self Care | Payer: BC Managed Care – PPO | Source: Ambulatory Visit | Attending: Family Medicine | Admitting: Family Medicine

## 2019-05-28 ENCOUNTER — Other Ambulatory Visit: Payer: Self-pay

## 2019-05-28 DIAGNOSIS — N6001 Solitary cyst of right breast: Secondary | ICD-10-CM | POA: Diagnosis not present

## 2019-05-28 DIAGNOSIS — E1122 Type 2 diabetes mellitus with diabetic chronic kidney disease: Secondary | ICD-10-CM | POA: Diagnosis not present

## 2019-05-28 DIAGNOSIS — R928 Other abnormal and inconclusive findings on diagnostic imaging of breast: Secondary | ICD-10-CM

## 2019-05-28 DIAGNOSIS — R202 Paresthesia of skin: Secondary | ICD-10-CM | POA: Diagnosis not present

## 2019-05-28 DIAGNOSIS — I10 Essential (primary) hypertension: Secondary | ICD-10-CM | POA: Diagnosis not present

## 2019-05-28 DIAGNOSIS — N1832 Chronic kidney disease, stage 3b: Secondary | ICD-10-CM | POA: Diagnosis not present

## 2019-05-28 DIAGNOSIS — R011 Cardiac murmur, unspecified: Secondary | ICD-10-CM | POA: Diagnosis not present

## 2019-05-28 DIAGNOSIS — E1169 Type 2 diabetes mellitus with other specified complication: Secondary | ICD-10-CM | POA: Diagnosis not present

## 2019-05-28 DIAGNOSIS — E782 Mixed hyperlipidemia: Secondary | ICD-10-CM | POA: Diagnosis not present

## 2019-05-28 DIAGNOSIS — D649 Anemia, unspecified: Secondary | ICD-10-CM | POA: Diagnosis not present

## 2019-06-25 ENCOUNTER — Ambulatory Visit: Payer: Medicare HMO | Admitting: Internal Medicine

## 2019-07-01 ENCOUNTER — Telehealth: Payer: Self-pay | Admitting: *Deleted

## 2019-07-01 NOTE — Telephone Encounter (Signed)
A message was left, re: her follow up visit. 

## 2019-07-27 ENCOUNTER — Ambulatory Visit (INDEPENDENT_AMBULATORY_CARE_PROVIDER_SITE_OTHER): Payer: BC Managed Care – PPO | Admitting: Internal Medicine

## 2019-07-27 ENCOUNTER — Other Ambulatory Visit: Payer: Self-pay

## 2019-07-27 ENCOUNTER — Encounter: Payer: Self-pay | Admitting: Internal Medicine

## 2019-07-27 VITALS — BP 136/88 | HR 77 | Temp 96.1°F | Ht 66.0 in | Wt 219.0 lb

## 2019-07-27 DIAGNOSIS — Z79899 Other long term (current) drug therapy: Secondary | ICD-10-CM | POA: Diagnosis not present

## 2019-07-27 DIAGNOSIS — E7800 Pure hypercholesterolemia, unspecified: Secondary | ICD-10-CM

## 2019-07-27 DIAGNOSIS — I10 Essential (primary) hypertension: Secondary | ICD-10-CM

## 2019-07-27 DIAGNOSIS — R079 Chest pain, unspecified: Secondary | ICD-10-CM

## 2019-07-27 MED ORDER — METOPROLOL SUCCINATE ER 25 MG PO TB24: 25.0000 mg | ORAL_TABLET | Freq: Every day | ORAL | 3 refills | Status: DC

## 2019-07-27 NOTE — Progress Notes (Signed)
Cardiology Office Note:    Date:  07/27/2019   ID:  Jamie Payne, DOB 1951-04-12, MRN [id number]  PCP:  Antony Contras, MD  Cardiologist:  Elouise Munroe, MD  Electrophysiologist:  None   Referring MD: Antony Contras, MD   Chief Complaint: follow up chest pain  History of Present Illness:    Jamie Payne is a 69 y.o. female with a history of glaucoma, HTN,DM,and family history of heart disease. She presents today for follow-up and describes chest pain.  She tells me that she has chest pain after eating while lying down approximately 1-2 times per week.  She thinks it is related to GERD.  He does however describe exertional shortness of breath and chest discomfort with fatigue.  We have performed a CT coronary angiogram which showed nonobstructive CAD as well as a stress Myoview which was negative but low workload.  She has had an echocardiogram last year demonstrating normal LV function, hyperdynamic EF greater than 65%, moderate concentric LVH, and grade 1 diastolic dysfunction with increased LV filling pressures surrogate. Past Medical History:  Diagnosis Date  . Glaucoma   . Hypertension   . Low hemoglobin     Past Surgical History:  Procedure Laterality Date  . BILATERAL SALPINGECTOMY    . BREAST BIOPSY Left 01/02/2017   negative  . COLONOSCOPY N/A 12/04/2013   Procedure: COLONOSCOPY;  Surgeon: Missy Sabins, MD;  Location: [facility];  Service: Endoscopy;  Laterality: N/A;  . ESOPHAGOGASTRODUODENOSCOPY N/A 12/05/2013   Procedure: ESOPHAGOGASTRODUODENOSCOPY (EGD);  Surgeon: Jeryl Columbia, MD;  Location: [facility] ENDOSCOPY;  Service: Endoscopy;  Laterality: N/A;  . HERNIA REPAIR      Current Medications: Current Meds  Medication Sig  . amLODipine (NORVASC) 10 MG tablet Take 10 mg by mouth daily.  . cloNIDine (CATAPRES) 0.2 MG tablet Take 1 tablet by mouth daily.  . dorzolamide-timolol (COSOPT) 22.3-6.8 MG/ML ophthalmic solution Place 1 drop into the left eye 2 (two) times  daily.  . hydrALAZINE (APRESOLINE) 25 MG tablet Take 1 tablet (25 mg total) by mouth 2 (two) times daily.  . meloxicam (MOBIC) 15 MG tablet Take 15 mg by mouth daily as needed for pain.   . rosuvastatin (CRESTOR) 5 MG tablet Take 5 mg by mouth daily.  Nelva Nay SOLOSTAR 300 UNIT/ML SOPN INJECT 28 UNITS ONCE A DAY     Allergies:   Lisinopril, Penicillins, Crestor [rosuvastatin calcium], and Influenza vaccine live   Social History   Socioeconomic History  . Marital status: Married    Spouse name: Not on file  . Number of children: Not on file  . Years of education: Not on file  . Highest education level: Not on file  Occupational History  . Not on file  Tobacco Use  . Smoking status: Never Smoker  . Smokeless tobacco: Never Used  Substance and Sexual Activity  . Alcohol use: No  . Drug use: No  . Sexual activity: Not on file  Other Topics Concern  . Not on file  Social History Narrative  . Not on file   Social Determinants of Health   Financial Resource Strain:   . Difficulty of Paying Living Expenses:   Food Insecurity:   . Worried About Charity fundraiser in the Last Year:   . Arboriculturist in the Last Year:   Transportation Needs:   . Film/video editor (Medical):   Marland Kitchen Lack of Transportation (Non-Medical):   Physical Activity:   .  Days of Exercise per Week:   . Minutes of Exercise per Session:   Stress:   . Feeling of Stress :   Social Connections:   . Frequency of Communication with Friends and Family:   . Frequency of Social Gatherings with Friends and Family:   . Attends Religious Services:   . Active Member of Clubs or Organizations:   . Attends Archivist Meetings:   Marland Kitchen Marital Status:      Family History: The patient's family history includes Bipolar disorder in her daughter and daughter; Breast cancer in her maternal grandmother and paternal grandmother; Diabetes in her mother; Hypertension in her father and mother; Pancreatic cancer in  her brother and brother; Renal Disease in her father.  ROS:   Please see the history of present illness.    All other systems reviewed and are negative.  EKGs/Labs/Other Studies Reviewed:    The following studies were reviewed today:  EKG:  NSR, RBBB, LAFB  Recent Labs: 12/10/2018: BUN 16; Creatinine, Ser 1.47; Potassium 3.9; Sodium 141  Recent Lipid Panel    Component Value Date/Time   CHOL 186 11/28/2018 0849   TRIG 196 (H) 11/28/2018 0849   HDL 40 11/28/2018 0849   CHOLHDL 4.7 (H) 11/28/2018 0849   LDLCALC 107 (H) 11/28/2018 0849    Physical Exam:    VS:  BP 136/88 (BP Location: Left Arm, Patient Position: Sitting, Cuff Size: Large)   Pulse 77   Temp (!) 96.1 F (35.6 C)   Ht 5\' 6"  (1.676 m)   Wt 219 lb (99.3 kg)   BMI 35.35 kg/m     Wt Readings from Last 5 Encounters:  07/27/19 219 lb (99.3 kg)  12/25/18 200 lb (90.7 kg)  12/10/18 200 lb (90.7 kg)  11/19/18 200 lb (90.7 kg)  08/19/18 207 lb (93.9 kg)     Constitutional: No acute distress Eyes: sclera non-icteric, normal conjunctiva and lids ENMT: Mask in place Cardiovascular: regular rhythm, normal rate, no murmurs. S1 and S2 normal. Radial pulses normal bilaterally. No jugular venous distention.  Respiratory: clear to auscultation bilaterally GI : normal bowel sounds, soft and nontender. No distention.   MSK: extremities warm, well perfused. No edema.  NEURO: grossly nonfocal exam, moves all extremities. PSYCH: alert and oriented x 3, normal mood and affect.   ASSESSMENT:    1. Chest pain, unspecified type   2. Essential hypertension   3. Hypercholesterolemia   4. Medication management    PLAN:    Chest pain -her chest pain seems more consistent with a GI etiology given several negative ischemic evaluations and symptoms after eating.  She will try Tums and H2 blockers to alleviate symptoms and monitor.  If no relief, we will consider further testing.  HTN -she is currently on amlodipine, clonidine,  hydralazine for blood pressure control.  She has a hyperdynamic EF, and may benefit from beta-blockade diastolic filling and further lowering of blood pressure.  We will start metoprolol succinate 25 mg daily and monitor for response.  Hyperlipidemia-continue rosuvastatin 5 mg daily.  Follow up virtual 3 mo  Total time of encounter: 30 minutes total time of encounter, including 25 minutes spent in face-to-face patient care on the date of this encounter. This time includes coordination of care and counseling regarding above mentioned problem list. Remainder of non-face-to-face time involved reviewing chart documents/testing relevant to the patient encounter and documentation in the medical record. I have independently reviewed documentation from referring provider.   Cherlynn Kaiser, MD  [place]  [facility]    Medication Adjustments/Labs and Tests Ordered: Current medicines are reviewed at length with the patient today.  Concerns regarding medicines are outlined above.  Orders Placed This Encounter  Procedures  . EKG 12-Lead   Meds ordered this encounter  Medications  . metoprolol succinate (TOPROL XL) 25 MG 24 hr tablet    Sig: Take 1 tablet (25 mg total) by mouth daily.    Dispense:  90 tablet    Refill:  3    Patient Instructions  Medication Instructions:   Start taking Toprol XL 25 mg ( metoprolol succinate )  One tablet daily   *If you need a refill on your cardiac medications before your next appointment, please call your pharmacy*   Lab Work: Not needed   Testing/Procedures: Not needed   Follow-Up: At [facility], you and your health needs are our priority.  As part of our continuing mission to provide you with exceptional heart care, we have created designated Provider Care Teams.  These Care Teams include your primary Cardiologist (physician) and Advanced Practice Providers (APPs -  Physician Assistants and Nurse Practitioners) who all work together to  provide you with the care you need, when you need it.  We recommend signing up for the patient portal called "MyChart".  Sign up information is provided on this After Visit Summary.  MyChart is used to connect with patients for Virtual Visits (Telemedicine).  Patients are able to view lab/test results, encounter notes, upcoming appointments, etc.  Non-urgent messages can be sent to your provider as well.   To learn more about what you can do with MyChart, go to [email or web address].    Your next appointment:   3 month(s)  The format for your next appointment:   Virtual Visit   Provider:   Cherlynn Kaiser, MD   Other Instructions

## 2019-07-27 NOTE — Patient Instructions (Signed)
Medication Instructions:   Start taking Toprol XL 25 mg ( metoprolol succinate )  One tablet daily   *If you need a refill on your cardiac medications before your next appointment, please call your pharmacy*   Lab Work: Not needed   Testing/Procedures: Not needed   Follow-Up: At [facility] you and your health needs are our priority.  As part of our continuing mission to provide you with exceptional heart care, we have created designated Provider Care Teams.  These Care Teams include your primary Cardiologist (physician) and Advanced Practice Providers (APPs -  Physician Assistants and Nurse Practitioners) who all work together to provide you with the care you need, when you need it.  We recommend signing up for the patient portal called "MyChart".  Sign up information is provided on this After Visit Summary.  MyChart is used to connect with patients for Virtual Visits (Telemedicine).  Patients are able to view lab/test results, encounter notes, upcoming appointments, etc.  Non-urgent messages can be sent to your provider as well.   To learn more about what you can do with MyChart, go to [email or web address].    Your next appointment:   3 month(s)  The format for your next appointment:   Virtual Visit   Provider:   Cherlynn Kaiser, MD   Other Instructions

## 2019-07-28 DIAGNOSIS — N183 Chronic kidney disease, stage 3 unspecified: Secondary | ICD-10-CM | POA: Diagnosis not present

## 2019-07-28 DIAGNOSIS — D649 Anemia, unspecified: Secondary | ICD-10-CM | POA: Diagnosis not present

## 2019-07-28 DIAGNOSIS — I10 Essential (primary) hypertension: Secondary | ICD-10-CM | POA: Diagnosis not present

## 2019-07-28 DIAGNOSIS — E782 Mixed hyperlipidemia: Secondary | ICD-10-CM | POA: Diagnosis not present

## 2019-08-19 DIAGNOSIS — E119 Type 2 diabetes mellitus without complications: Secondary | ICD-10-CM | POA: Diagnosis not present

## 2019-08-19 DIAGNOSIS — I10 Essential (primary) hypertension: Secondary | ICD-10-CM | POA: Diagnosis not present

## 2019-08-19 DIAGNOSIS — Z961 Presence of intraocular lens: Secondary | ICD-10-CM | POA: Diagnosis not present

## 2019-08-19 DIAGNOSIS — D649 Anemia, unspecified: Secondary | ICD-10-CM | POA: Diagnosis not present

## 2019-08-19 DIAGNOSIS — H35031 Hypertensive retinopathy, right eye: Secondary | ICD-10-CM | POA: Diagnosis not present

## 2019-08-19 DIAGNOSIS — E782 Mixed hyperlipidemia: Secondary | ICD-10-CM | POA: Diagnosis not present

## 2019-08-19 DIAGNOSIS — N183 Chronic kidney disease, stage 3 unspecified: Secondary | ICD-10-CM | POA: Diagnosis not present

## 2019-08-19 DIAGNOSIS — H401133 Primary open-angle glaucoma, bilateral, severe stage: Secondary | ICD-10-CM | POA: Diagnosis not present

## 2019-09-07 DIAGNOSIS — E1169 Type 2 diabetes mellitus with other specified complication: Secondary | ICD-10-CM | POA: Diagnosis not present

## 2019-09-07 DIAGNOSIS — D649 Anemia, unspecified: Secondary | ICD-10-CM | POA: Diagnosis not present

## 2019-09-07 DIAGNOSIS — D509 Iron deficiency anemia, unspecified: Secondary | ICD-10-CM | POA: Diagnosis not present

## 2019-09-07 DIAGNOSIS — I10 Essential (primary) hypertension: Secondary | ICD-10-CM | POA: Diagnosis not present

## 2019-09-30 DIAGNOSIS — Z03818 Encounter for observation for suspected exposure to other biological agents ruled out: Secondary | ICD-10-CM | POA: Diagnosis not present

## 2019-10-02 DIAGNOSIS — Q399 Congenital malformation of esophagus, unspecified: Secondary | ICD-10-CM | POA: Diagnosis not present

## 2019-10-02 DIAGNOSIS — K317 Polyp of stomach and duodenum: Secondary | ICD-10-CM | POA: Diagnosis not present

## 2019-10-02 DIAGNOSIS — K222 Esophageal obstruction: Secondary | ICD-10-CM | POA: Diagnosis not present

## 2019-10-02 DIAGNOSIS — K449 Diaphragmatic hernia without obstruction or gangrene: Secondary | ICD-10-CM | POA: Diagnosis not present

## 2019-10-06 DIAGNOSIS — E782 Mixed hyperlipidemia: Secondary | ICD-10-CM | POA: Diagnosis not present

## 2019-10-06 DIAGNOSIS — N183 Chronic kidney disease, stage 3 unspecified: Secondary | ICD-10-CM | POA: Diagnosis not present

## 2019-10-06 DIAGNOSIS — H35033 Hypertensive retinopathy, bilateral: Secondary | ICD-10-CM | POA: Diagnosis not present

## 2019-10-06 DIAGNOSIS — E1169 Type 2 diabetes mellitus with other specified complication: Secondary | ICD-10-CM | POA: Diagnosis not present

## 2019-10-06 DIAGNOSIS — N1832 Chronic kidney disease, stage 3b: Secondary | ICD-10-CM | POA: Diagnosis not present

## 2019-10-06 DIAGNOSIS — I10 Essential (primary) hypertension: Secondary | ICD-10-CM | POA: Diagnosis not present

## 2019-10-06 DIAGNOSIS — D649 Anemia, unspecified: Secondary | ICD-10-CM | POA: Diagnosis not present

## 2019-10-21 ENCOUNTER — Telehealth: Payer: Self-pay | Admitting: *Deleted

## 2019-10-21 ENCOUNTER — Telehealth: Payer: Medicare HMO | Admitting: Internal Medicine

## 2019-10-21 ENCOUNTER — Encounter: Payer: Self-pay | Admitting: *Deleted

## 2019-10-21 ENCOUNTER — Other Ambulatory Visit: Payer: Self-pay

## 2019-10-21 NOTE — Telephone Encounter (Signed)
2nd attempt  no answer , left message  will try again

## 2019-10-21 NOTE — Telephone Encounter (Signed)
telehealth consent done 10/2018.  first attempted  No answer on home phone will call back  Tried all other number unable reach patient

## 2019-10-21 NOTE — Telephone Encounter (Signed)
3rd attempt left message to call and reschedule  patient call back  will continue with visit

## 2019-12-07 DIAGNOSIS — D509 Iron deficiency anemia, unspecified: Secondary | ICD-10-CM | POA: Diagnosis not present

## 2019-12-07 DIAGNOSIS — H35033 Hypertensive retinopathy, bilateral: Secondary | ICD-10-CM | POA: Diagnosis not present

## 2019-12-07 DIAGNOSIS — N1832 Chronic kidney disease, stage 3b: Secondary | ICD-10-CM | POA: Diagnosis not present

## 2019-12-07 DIAGNOSIS — E1169 Type 2 diabetes mellitus with other specified complication: Secondary | ICD-10-CM | POA: Diagnosis not present

## 2019-12-07 DIAGNOSIS — E782 Mixed hyperlipidemia: Secondary | ICD-10-CM | POA: Diagnosis not present

## 2019-12-07 DIAGNOSIS — D649 Anemia, unspecified: Secondary | ICD-10-CM | POA: Diagnosis not present

## 2019-12-07 DIAGNOSIS — I10 Essential (primary) hypertension: Secondary | ICD-10-CM | POA: Diagnosis not present

## 2019-12-29 DIAGNOSIS — I10 Essential (primary) hypertension: Secondary | ICD-10-CM | POA: Diagnosis not present

## 2019-12-29 DIAGNOSIS — D649 Anemia, unspecified: Secondary | ICD-10-CM | POA: Diagnosis not present

## 2019-12-29 DIAGNOSIS — N1832 Chronic kidney disease, stage 3b: Secondary | ICD-10-CM | POA: Diagnosis not present

## 2019-12-29 DIAGNOSIS — R011 Cardiac murmur, unspecified: Secondary | ICD-10-CM | POA: Diagnosis not present

## 2019-12-29 DIAGNOSIS — L680 Hirsutism: Secondary | ICD-10-CM | POA: Diagnosis not present

## 2019-12-29 DIAGNOSIS — E782 Mixed hyperlipidemia: Secondary | ICD-10-CM | POA: Diagnosis not present

## 2019-12-29 DIAGNOSIS — E1169 Type 2 diabetes mellitus with other specified complication: Secondary | ICD-10-CM | POA: Diagnosis not present

## 2020-01-24 IMAGING — MG DIGITAL SCREENING BILATERAL MAMMOGRAM WITH CAD
4 series · 4 of 4 positions shown · non-contrast
Comparison: Previous exam(s).

CLINICAL DATA: Screening.

EXAM:
DIGITAL SCREENING BILATERAL MAMMOGRAM WITH CAD

[R MLO]
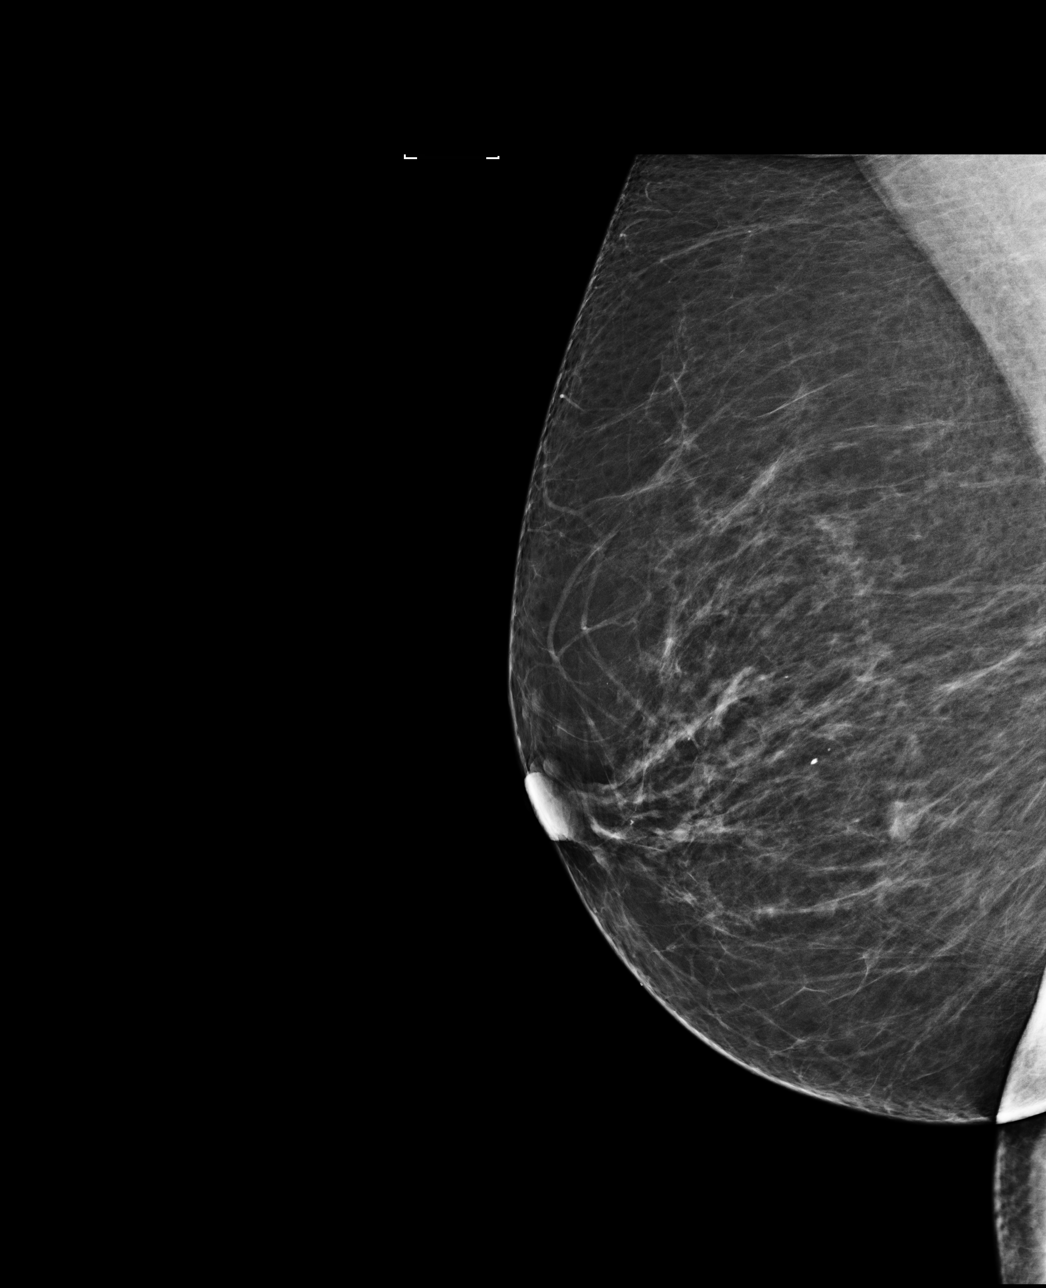

[R CC]
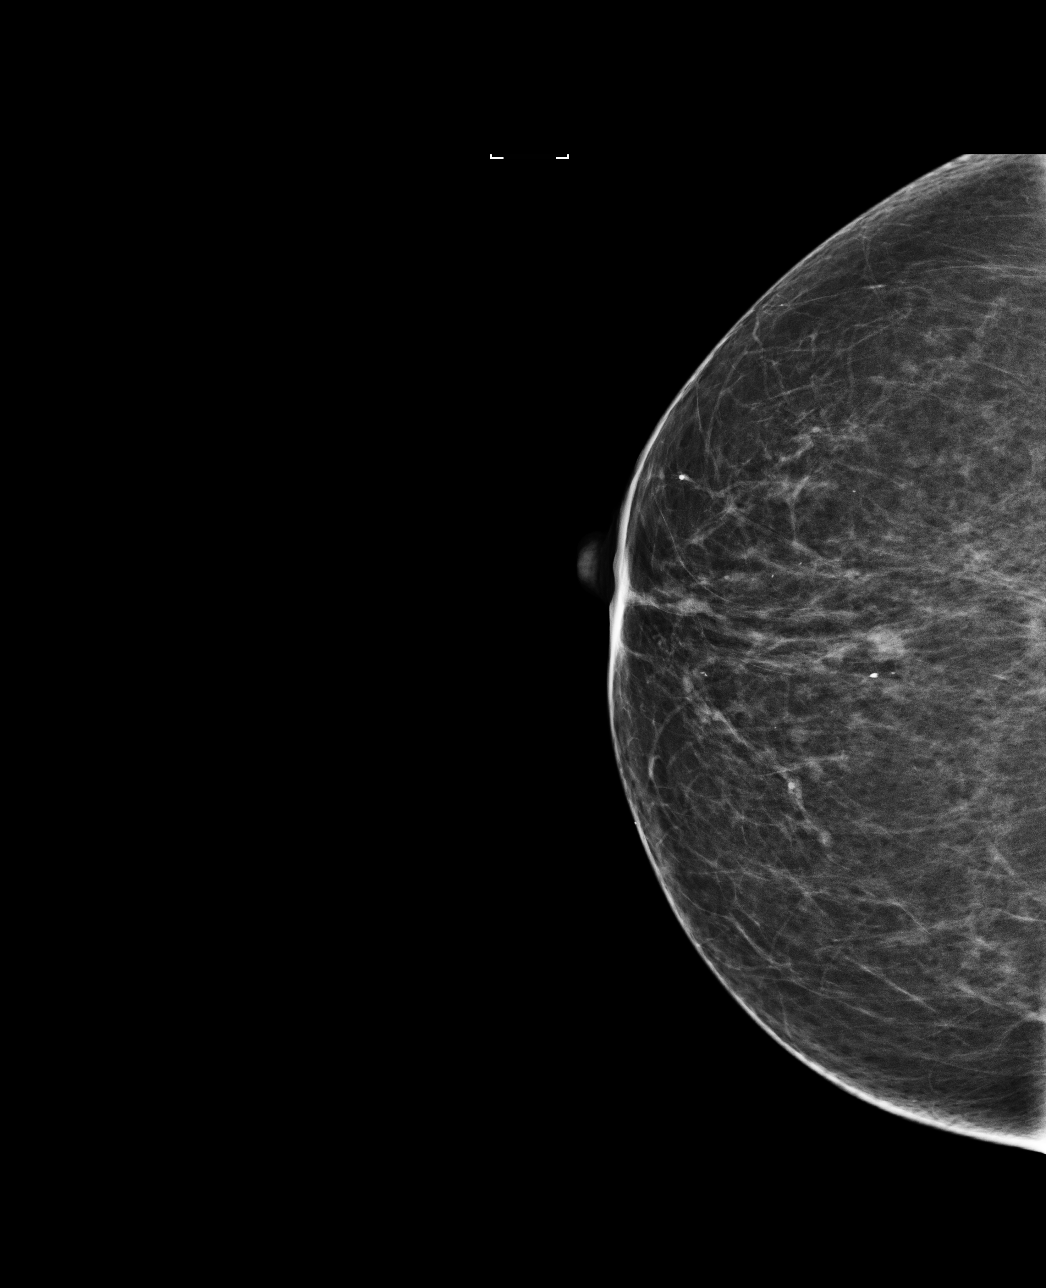

[L MLO]
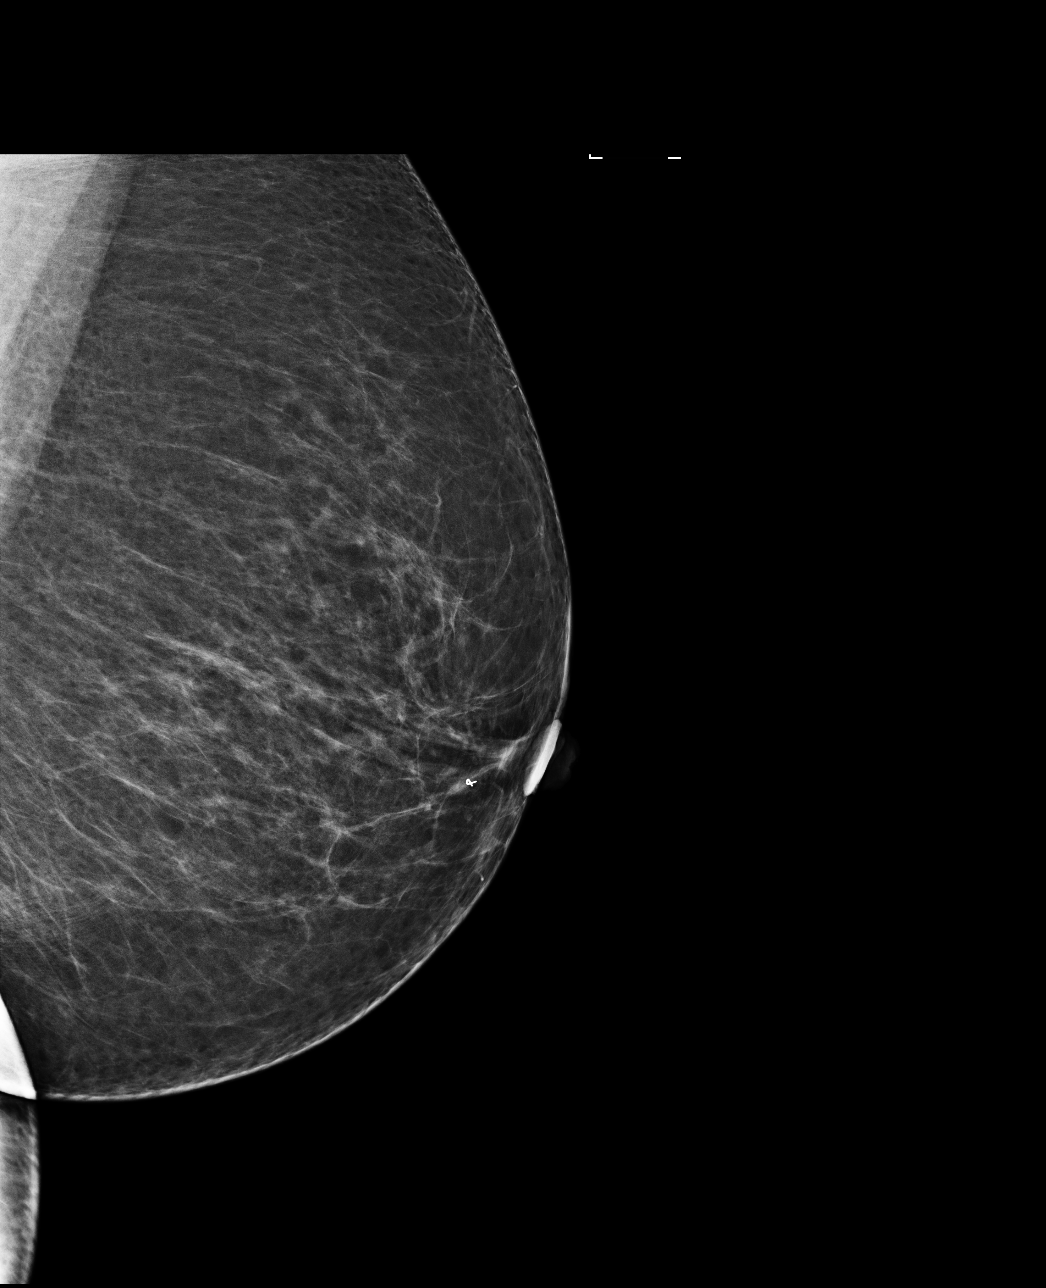

[L CC]
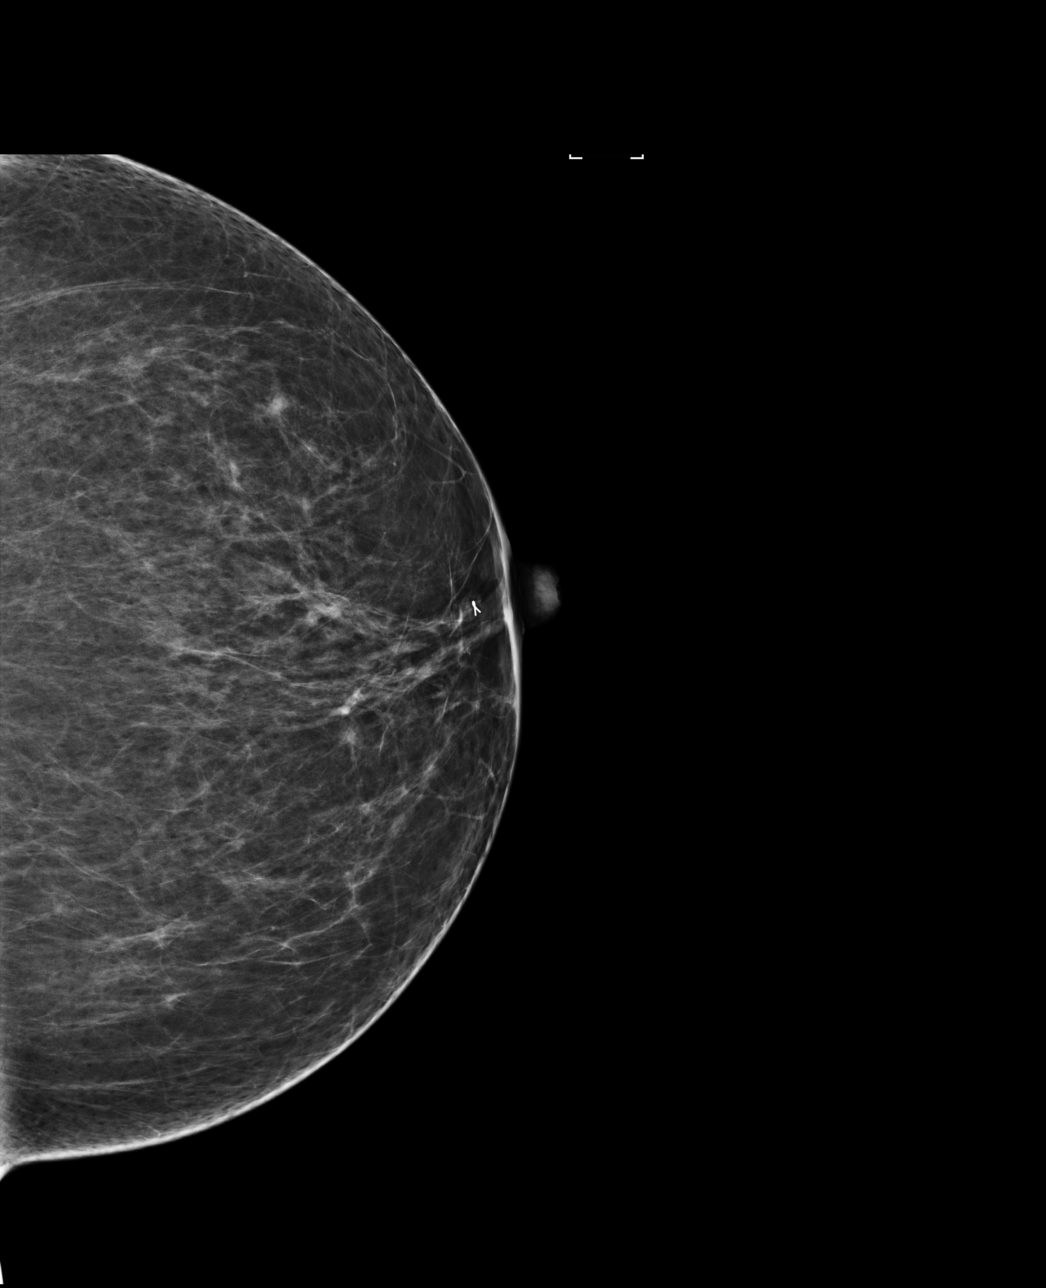

[4 of 4 positions shown; findings below may reference images not displayed]

ACR Breast Density Category b: There are scattered areas of
fibroglandular density.
FINDINGS: There are no findings suspicious for malignancy. Images were
processed with CAD.
IMPRESSION: No mammographic evidence of malignancy. A result letter of this
screening mammogram will be mailed directly to the patient.

RECOMMENDATION:
Screening mammogram in one year. (Code:AS-G-LCT)

BI-RADS CATEGORY  1: Negative.

## 2020-01-26 DIAGNOSIS — L680 Hirsutism: Secondary | ICD-10-CM | POA: Diagnosis not present

## 2020-01-26 DIAGNOSIS — L918 Other hypertrophic disorders of the skin: Secondary | ICD-10-CM | POA: Diagnosis not present

## 2020-01-27 DIAGNOSIS — E1169 Type 2 diabetes mellitus with other specified complication: Secondary | ICD-10-CM | POA: Diagnosis not present

## 2020-01-27 DIAGNOSIS — D509 Iron deficiency anemia, unspecified: Secondary | ICD-10-CM | POA: Diagnosis not present

## 2020-01-27 DIAGNOSIS — D649 Anemia, unspecified: Secondary | ICD-10-CM | POA: Diagnosis not present

## 2020-01-27 DIAGNOSIS — E782 Mixed hyperlipidemia: Secondary | ICD-10-CM | POA: Diagnosis not present

## 2020-02-03 ENCOUNTER — Other Ambulatory Visit: Payer: Self-pay | Admitting: Internal Medicine

## 2020-02-03 DIAGNOSIS — D509 Iron deficiency anemia, unspecified: Secondary | ICD-10-CM | POA: Diagnosis not present

## 2020-02-03 DIAGNOSIS — E1169 Type 2 diabetes mellitus with other specified complication: Secondary | ICD-10-CM | POA: Diagnosis not present

## 2020-02-03 DIAGNOSIS — E782 Mixed hyperlipidemia: Secondary | ICD-10-CM | POA: Diagnosis not present

## 2020-02-03 DIAGNOSIS — D649 Anemia, unspecified: Secondary | ICD-10-CM | POA: Diagnosis not present

## 2020-02-03 NOTE — Telephone Encounter (Signed)
*  STAT* If patient is at the pharmacy, call can be transferred to refill team.   1. Which medications need to be refilled? (please list name of each medication and dose if known) Hydralazine  2. Which pharmacy/location (including street and city if local pharmacy) is medication to be sent to? [facility]  3. Do they need a 30 day or 90 day supply? 90 days and refills

## 2020-02-04 MED ORDER — HYDRALAZINE HCL 25 MG PO TABS: 25.0000 mg | ORAL_TABLET | Freq: Two times a day (BID) | ORAL | 2 refills | Status: DC

## 2020-02-04 NOTE — Telephone Encounter (Signed)
Refill for Hydralazine has been sent to pharmacy.

## 2020-02-29 DIAGNOSIS — Z961 Presence of intraocular lens: Secondary | ICD-10-CM | POA: Diagnosis not present

## 2020-02-29 DIAGNOSIS — H401133 Primary open-angle glaucoma, bilateral, severe stage: Secondary | ICD-10-CM | POA: Diagnosis not present

## 2020-03-09 DIAGNOSIS — D509 Iron deficiency anemia, unspecified: Secondary | ICD-10-CM | POA: Diagnosis not present

## 2020-03-09 DIAGNOSIS — D649 Anemia, unspecified: Secondary | ICD-10-CM | POA: Diagnosis not present

## 2020-03-09 DIAGNOSIS — E1169 Type 2 diabetes mellitus with other specified complication: Secondary | ICD-10-CM | POA: Diagnosis not present

## 2020-03-09 DIAGNOSIS — E782 Mixed hyperlipidemia: Secondary | ICD-10-CM | POA: Diagnosis not present

## 2020-03-18 DIAGNOSIS — D649 Anemia, unspecified: Secondary | ICD-10-CM | POA: Diagnosis not present

## 2020-04-01 DIAGNOSIS — I10 Essential (primary) hypertension: Secondary | ICD-10-CM | POA: Diagnosis not present

## 2020-04-01 DIAGNOSIS — E782 Mixed hyperlipidemia: Secondary | ICD-10-CM | POA: Diagnosis not present

## 2020-04-01 DIAGNOSIS — H35033 Hypertensive retinopathy, bilateral: Secondary | ICD-10-CM | POA: Diagnosis not present

## 2020-04-01 DIAGNOSIS — D649 Anemia, unspecified: Secondary | ICD-10-CM | POA: Diagnosis not present

## 2020-05-23 DIAGNOSIS — E1169 Type 2 diabetes mellitus with other specified complication: Secondary | ICD-10-CM | POA: Diagnosis not present

## 2020-05-23 DIAGNOSIS — N1832 Chronic kidney disease, stage 3b: Secondary | ICD-10-CM | POA: Diagnosis not present

## 2020-05-23 DIAGNOSIS — I10 Essential (primary) hypertension: Secondary | ICD-10-CM | POA: Diagnosis not present

## 2020-05-23 DIAGNOSIS — E782 Mixed hyperlipidemia: Secondary | ICD-10-CM | POA: Diagnosis not present

## 2020-05-29 ENCOUNTER — Other Ambulatory Visit: Payer: Self-pay | Admitting: Internal Medicine

## 2020-06-04 ENCOUNTER — Other Ambulatory Visit: Payer: Self-pay

## 2020-06-04 ENCOUNTER — Encounter (HOSPITAL_COMMUNITY): Payer: Self-pay | Admitting: Urgent Care

## 2020-06-04 ENCOUNTER — Ambulatory Visit (HOSPITAL_COMMUNITY)
Admission: EM | Admit: 2020-06-04 | Discharge: 2020-06-04 | Disposition: A | Discharge status: Home / Self Care | Payer: BC Managed Care – PPO | Attending: Urgent Care | Admitting: Urgent Care

## 2020-06-04 DIAGNOSIS — I10 Essential (primary) hypertension: Secondary | ICD-10-CM

## 2020-06-04 DIAGNOSIS — R531 Weakness: Secondary | ICD-10-CM | POA: Diagnosis not present

## 2020-06-04 DIAGNOSIS — R5383 Other fatigue: Secondary | ICD-10-CM

## 2020-06-04 LAB — CBC
HCT: 38.2 % (ref 36.0–46.0)
Hemoglobin: 12 g/dL (ref 12.0–15.0)
MCH: 26.7 pg (ref 26.0–34.0)
MCHC: 31.4 g/dL (ref 30.0–36.0)
MCV: 84.9 fL (ref 80.0–100.0)
Platelets: 283 10*3/uL (ref 150–400)
RBC: 4.5 MIL/uL (ref 3.87–5.11)
RDW: 15.9 % — ABNORMAL HIGH (ref 11.5–15.5)
WBC: 6.8 10*3/uL (ref 4.0–10.5)
nRBC: 0 % (ref 0.0–0.2)

## 2020-06-04 LAB — POCT URINALYSIS DIPSTICK, ED / UC
Bilirubin Urine: NEGATIVE
Glucose, UA: NEGATIVE mg/dL
Hgb urine dipstick: NEGATIVE
Ketones, ur: NEGATIVE mg/dL
Nitrite: NEGATIVE
Protein, ur: 100 mg/dL — AB
Specific Gravity, Urine: >=1.03 (ref 1.005–1.030)
Urobilinogen, UA: 0.2 mg/dL (ref 0.0–1.0)
pH: 5.5 (ref 5.0–8.0)

## 2020-06-04 NOTE — ED Triage Notes (Signed)
Pt reports elevated BP on Wednesday 150/89 . Blood sugar in normal limits and pt reported the room was spinning . Pt reports all the Sx's have resolved but Pt now reports feeling weak.

## 2020-06-04 NOTE — Discharge Instructions (Signed)
Try to hydrate well with at least 64 ounces of water daily. Please continue taking your regular medications. Make sure you hydrate well, avoid strenuous physical activity. If you develop chest pain, difficulty breathing, severe abdominal pain, confusion then please report to the emergency room immediately by having a calm friend/family member drive you there or by calling an ambulance. Otherwise, make sure you follow up with your regular doctor as soon as possible. I will call you if your results show you need an emergency room visit today. If your labs are unremarkable I will not contact you today and have you follow up with your regular doctor.

## 2020-06-04 NOTE — ED Provider Notes (Signed)
[place]   MRN: [id number] DOB: August 23, 1950  Subjective:   Jamie Payne is a 70 y.o. female presenting for an evaluation for fatigue. Patient has felt weak and low energy, had an episode of vertigo and abdominal pain 3 days ago (now resolved). She did check her bp at the time and was 150/89. Her blood sugar was also 150. Since then it has been between 110-120s. Denies active h/a, confusion, dizziness, vision change, chest pain, shob, heart racing, palpitations, abdominal pain, n/v, dysuria, hematuria. Has a history of HTN, CKD III, anemia. Also has a history of a heart murmur. No history of stroke, MI, heart disease. Not a smoker. No alcohol use.   No current facility-administered medications for this encounter.  Current Outpatient Medications:  .  amLODipine (NORVASC) 10 MG tablet, Take 10 mg by mouth daily., Disp: , Rfl:  .  cloNIDine (CATAPRES) 0.2 MG tablet, Take 1 tablet by mouth daily., Disp: , Rfl:  .  dorzolamide-timolol (COSOPT) 22.3-6.8 MG/ML ophthalmic solution, Place 1 drop into the left eye 2 (two) times daily., Disp: , Rfl:  .  hydrALAZINE (APRESOLINE) 25 MG tablet, Take 1 tablet (25 mg total) by mouth 2 (two) times daily., Disp: 180 tablet, Rfl: 2 .  metoprolol succinate (TOPROL XL) 25 MG 24 hr tablet, Take 1 tablet (25 mg total) by mouth daily., Disp: 90 tablet, Rfl: 3 .  rosuvastatin (CRESTOR) 5 MG tablet, Take 5 mg by mouth daily., Disp: , Rfl:  .  TOUJEO SOLOSTAR 300 UNIT/ML SOPN, INJECT 28 UNITS ONCE A DAY, Disp: , Rfl:  .  meloxicam (MOBIC) 15 MG tablet, Take 15 mg by mouth daily as needed for pain. , Disp: , Rfl:    Allergies  Allergen Reactions  . Lisinopril Anaphylaxis  . Penicillins Anaphylaxis  . Crestor [Rosuvastatin Calcium]   . Influenza Vaccine Live Other (See Comments)    "caused me to get sick"    Past Medical History:  Diagnosis Date  . Glaucoma   . Hypertension   . Low hemoglobin      Past Surgical History:  Procedure  Laterality Date  . BILATERAL SALPINGECTOMY    . BREAST BIOPSY Left 01/02/2017   negative  . COLONOSCOPY N/A 12/04/2013   Procedure: COLONOSCOPY;  Surgeon: Missy Sabins, MD;  Location: [place];  Service: Endoscopy;  Laterality: N/A;  . ESOPHAGOGASTRODUODENOSCOPY N/A 12/05/2013   Procedure: ESOPHAGOGASTRODUODENOSCOPY (EGD);  Surgeon: Jeryl Columbia, MD;  Location[facility]COPY;  Service: Endoscopy;  Laterality: N/A;  . HERNIA REPAIR      Family History  Problem Relation Age of Onset  . Hypertension Mother   . Diabetes Mother   . Breast cancer Maternal Grandmother   . Breast cancer Paternal Grandmother   . Hypertension Father   . Renal Disease Father   . Pancreatic cancer Brother   . Bipolar disorder Daughter   . Bipolar disorder Daughter   . Pancreatic cancer Brother     Social History   Tobacco Use  . Smoking status: Never Smoker  . Smokeless tobacco: Never Used  Substance Use Topics  . Alcohol use: No  . Drug use: No    ROS   Objective:   Vitals: BP 122/83 (BP Location: Left Arm)   Pulse 80   Temp 98.3 F (36.8 C) (Oral)   Resp (!) 8   SpO2 97%   Physical Exam Constitutional:      General: She is not in acute distress.  Appearance: Normal appearance. She is well-developed and normal weight. She is not ill-appearing, toxic-appearing or diaphoretic.  HENT:     Head: Normocephalic and atraumatic.     Right Ear: External ear normal.     Left Ear: External ear normal.     Nose: Nose normal.     Mouth/Throat:     Mouth: Mucous membranes are moist.     Pharynx: Oropharynx is clear.  Eyes:     General: No scleral icterus.    Extraocular Movements: Extraocular movements intact.     Pupils: Pupils are equal, round, and reactive to light.  Cardiovascular:     Rate and Rhythm: Normal rate and regular rhythm.     Pulses: Normal pulses.     Heart sounds: Murmur (not new) heard.  No friction rub. No gallop.   Pulmonary:     Effort: Pulmonary effort is normal.  No respiratory distress.     Breath sounds: Normal breath sounds. No stridor. No wheezing, rhonchi or rales.  Abdominal:     General: Bowel sounds are normal. There is no distension.     Palpations: Abdomen is soft. There is no mass.     Tenderness: There is abdominal tenderness (mild). There is no right CVA tenderness, left CVA tenderness, guarding or rebound.  Skin:    General: Skin is warm and dry.     Coloration: Skin is not pale.     Findings: No rash.  Neurological:     General: No focal deficit present.     Mental Status: She is alert and oriented to person, place, and time.     Cranial Nerves: No cranial nerve deficit.     Motor: No weakness.     Coordination: Coordination normal.     Gait: Gait normal.     Deep Tendon Reflexes: Reflexes normal.     Comments: Negative Romberg and pronator drift.   Psychiatric:        Mood and Affect: Mood normal.        Behavior: Behavior normal.        Thought Content: Thought content normal.        Judgment: Judgment normal.      Results for orders placed or performed during the hospital encounter of 06/04/20 (from the past 24 hour(s))  POC Urinalysis dipstick     Status: Abnormal   Collection Time: 06/04/20  2:39 PM  Result Value Ref Range   Glucose, UA NEGATIVE NEGATIVE mg/dL   Bilirubin Urine NEGATIVE NEGATIVE   Ketones, ur NEGATIVE NEGATIVE mg/dL   Specific Gravity, Urine >=1.030 1.005 - 1.030   Hgb urine dipstick NEGATIVE NEGATIVE   pH 5.5 5.0 - 8.0   Protein, ur 100 (A) NEGATIVE mg/dL   Urobilinogen, UA 0.2 0.0 - 1.0 mg/dL   Nitrite NEGATIVE NEGATIVE   Leukocytes,Ua SMALL (A) NEGATIVE     Assessment and Plan :   PDMP not reviewed this encounter.  1. Weakness   2. Fatigue, unspecified type     Recommended hydrating better as she has mild dehydration on urinalysis. Physical exam findings, vital signs reassuring for outpatient management. CBC is pending, obtained in light of her history of GI bleed and current symptom  set of fatigue, weakness. Will follow up with patient if results show substantial anemia. Otherwise, follow up with PCP asap. Counseled patient on potential for adverse effects with medications prescribed/recommended today, ER and return-to-clinic precautions discussed, patient verbalized understanding.    Jaynee Eagles, [place] 06/04/20 1445

## 2020-06-06 ENCOUNTER — Other Ambulatory Visit: Payer: Self-pay | Admitting: Family Medicine

## 2020-06-06 DIAGNOSIS — E1169 Type 2 diabetes mellitus with other specified complication: Secondary | ICD-10-CM | POA: Diagnosis not present

## 2020-06-06 DIAGNOSIS — Z1231 Encounter for screening mammogram for malignant neoplasm of breast: Secondary | ICD-10-CM

## 2020-06-06 DIAGNOSIS — E782 Mixed hyperlipidemia: Secondary | ICD-10-CM | POA: Diagnosis not present

## 2020-06-06 DIAGNOSIS — I10 Essential (primary) hypertension: Secondary | ICD-10-CM | POA: Diagnosis not present

## 2020-06-06 DIAGNOSIS — N183 Chronic kidney disease, stage 3 unspecified: Secondary | ICD-10-CM | POA: Diagnosis not present

## 2020-06-07 ENCOUNTER — Telehealth: Payer: Self-pay | Admitting: Internal Medicine

## 2020-06-07 MED ORDER — METOPROLOL SUCCINATE ER 25 MG PO TB24: 25.0000 mg | ORAL_TABLET | Freq: Every day | ORAL | 0 refills | Status: DC

## 2020-06-07 NOTE — Telephone Encounter (Signed)
Jamie Payne with [facility] stated the patient will be out of medication this week. I made her aware that the patient is overdue for a follow-up appointment. Jamie Payne states she is unable to schedule for the patient, but she plans to reach out to the patient to have her call us for follow-up.  *STAT* If patient is at the pharmacy, call can be transferred to refill team.   1. Which medications need to be refilled? (please list name of each medication and dose if known) metoprolol succinate (TOPROL XL) 25 MG 24 hr tablet  2. Which pharmacy/location (including street and city if local pharmacy) is medication to be sent to? [facility] - [place], [place] - [place] [address]  3. Do they need a 30 day or 90 day supply? 90 day supply

## 2020-06-10 ENCOUNTER — Other Ambulatory Visit: Payer: Self-pay

## 2020-06-10 ENCOUNTER — Encounter: Payer: Self-pay | Admitting: Internal Medicine

## 2020-06-10 ENCOUNTER — Ambulatory Visit (INDEPENDENT_AMBULATORY_CARE_PROVIDER_SITE_OTHER): Payer: BC Managed Care – PPO | Admitting: Internal Medicine

## 2020-06-10 VITALS — BP 137/83 | HR 81 | Ht 66.0 in | Wt 211.4 lb

## 2020-06-10 DIAGNOSIS — R011 Cardiac murmur, unspecified: Secondary | ICD-10-CM

## 2020-06-10 DIAGNOSIS — I10 Essential (primary) hypertension: Secondary | ICD-10-CM | POA: Diagnosis not present

## 2020-06-10 DIAGNOSIS — Z79899 Other long term (current) drug therapy: Secondary | ICD-10-CM

## 2020-06-10 DIAGNOSIS — E7800 Pure hypercholesterolemia, unspecified: Secondary | ICD-10-CM | POA: Diagnosis not present

## 2020-06-10 DIAGNOSIS — R079 Chest pain, unspecified: Secondary | ICD-10-CM | POA: Diagnosis not present

## 2020-06-10 NOTE — Progress Notes (Signed)
Cardiology Office Note:    Date:  06/10/2020   ID:  Jamie Payne, DOB July 22, 1950, MRN [id number]  PCP:  Antony Contras, MD  Cardiologist:  Elouise Munroe, MD  Electrophysiologist:  None   Referring MD: Antony Contras, MD   Chief Complaint/Reason for Referral: Follow up  History of Present Illness:    Jamie Payne is a 70 y.o. female with a history of glaucoma, HTN, DM, and family history of heart disease.  She presents today for follow-up. Last appt 06/2019.  We have performed a CT coronary angiogram which showed nonobstructive CAD as well as a stress Myoview which was negative but low workload.   She has had an echocardiogram in 2020 demonstrating normal LV function, hyperdynamic EF greater than 65%, moderate concentric LVH, and grade 1 diastolic dysfunction with increased LV filling pressures surrogate.  Recent ED visit, dizzy and felt washed out and disoriented.  Her husband said she looked gray.  Felt to be due to dehydration and rehydrated at home with water and Gatorade and felt back to normal.  No other episodes of presyncope or syncope in the past year.  We discussed her conduction system delay and low threshold to call me if she has recurrent dizziness, presyncope, or truly has a syncopal episode.  With syncope she should consider going to the ER if this occurs.  Elevated blood pressure in the ER but otherwise has been relatively stable at home less than 140/90 when she checks it.  Past Medical History:  Diagnosis Date  . Glaucoma   . Hypertension   . Low hemoglobin     Past Surgical History:  Procedure Laterality Date  . BILATERAL SALPINGECTOMY    . BREAST BIOPSY Left 01/02/2017   negative  . COLONOSCOPY N/A 12/04/2013   Procedure: COLONOSCOPY;  Surgeon: Missy Sabins, MD;  Location: [place];  Service: Endoscopy;  Laterality: N/A;  . ESOPHAGOGASTRODUODENOSCOPY N/A 12/05/2013   Procedure: ESOPHAGOGASTRODUODENOSCOPY (EGD);  Surgeon: Jeryl Columbia, MD;  Location: [facility]  ENDOSCOPY;  Service: Endoscopy;  Laterality: N/A;  . HERNIA REPAIR      Current Medications: Current Meds  Medication Sig  . amLODipine (NORVASC) 10 MG tablet Take 10 mg by mouth daily.  . cloNIDine (CATAPRES) 0.2 MG tablet Take 1 tablet by mouth daily.  . dorzolamide-timolol (COSOPT) 22.3-6.8 MG/ML ophthalmic solution Place 1 drop into the left eye 2 (two) times daily.  . hydrALAZINE (APRESOLINE) 25 MG tablet Take 1 tablet (25 mg total) by mouth 2 (two) times daily.  . meloxicam (MOBIC) 15 MG tablet Take 15 mg by mouth daily as needed for pain.   . metoprolol succinate (TOPROL XL) 25 MG 24 hr tablet Take 1 tablet (25 mg total) by mouth daily.  . rosuvastatin (CRESTOR) 5 MG tablet Take 5 mg by mouth daily.  Nelva Nay SOLOSTAR 300 UNIT/ML SOPN INJECT 28 UNITS ONCE A DAY     Allergies:   Lisinopril, Penicillins, Crestor [rosuvastatin calcium], and Influenza vaccine live   Social History   Tobacco Use  . Smoking status: Never Smoker  . Smokeless tobacco: Never Used  Substance Use Topics  . Alcohol use: No  . Drug use: No     Family History: The patient's family history includes Bipolar disorder in her daughter and daughter; Breast cancer in her maternal grandmother and paternal grandmother; Diabetes in her mother; Hypertension in her father and mother; Pancreatic cancer in her brother and brother; Renal Disease in her father.  ROS:  Please see the history of present illness.    All other systems reviewed and are negative.  EKGs/Labs/Other Studies Reviewed:    The following studies were reviewed today:  EKG:  NSR, RBBB, inferior and anterior infarct pattern. L axis deviation.  Recent Labs: 06/04/2020: Hemoglobin 12.0; Platelets 283  Recent Lipid Panel    Component Value Date/Time   CHOL 186 11/28/2018 0849   TRIG 196 (H) 11/28/2018 0849   HDL 40 11/28/2018 0849   CHOLHDL 4.7 (H) 11/28/2018 0849   LDLCALC 107 (H) 11/28/2018 0849    Physical Exam:    VS:  BP 137/83    Pulse 81   Ht 5\' 6"  (1.676 m)   Wt 211 lb 6.4 oz (95.9 kg)   SpO2 98%   BMI 34.12 kg/m     Wt Readings from Last 5 Encounters:  06/10/20 211 lb 6.4 oz (95.9 kg)  07/27/19 219 lb (99.3 kg)  12/25/18 200 lb (90.7 kg)  12/10/18 200 lb (90.7 kg)  11/19/18 200 lb (90.7 kg)    Constitutional: No acute distress Eyes: sclera non-icteric, normal conjunctiva and lids ENMT: normal dentition, moist mucous membranes Cardiovascular: regular rhythm, normal rate, 2/6 mid peaking SEM. S1 and S2 normal. Radial pulses normal bilaterally. No jugular venous distention.  Respiratory: clear to auscultation bilaterally GI : normal bowel sounds, soft and nontender. No distention.   MSK: extremities warm, well perfused. No edema.  NEURO: grossly nonfocal exam, moves all extremities. PSYCH: alert and oriented x 3, normal mood and affect.   ASSESSMENT:    1. Essential hypertension   2. Chest pain, unspecified type   3. Hypercholesterolemia   4. Medication management   5. Murmur    PLAN:    Essential hypertension -Okay to continue regimen of amlodipine, clonidine, hydralazine, metoprolol succinate.  Blood pressure ideally would be closest to 120/80 but currently is less than 140/90 which is acceptable.  With recent presyncope, would not make adjustments at this time.  Recommend oral hydration.  Recommend compression stockings.  Chest pain, unspecified type -No recurrence of chest pain.  Hypercholesterolemia -Continue Crestor 5 mg daily.  Her triglycerides are elevated and she may need additional therapy but has not had repeat lipids since August.  She plans to go visit with [facility] in March and plans to have repeat labs drawn at that time.  If triglycerides remain elevated would consider increasing dose of Crestor or additional therapy.  Medication management-metoprolol refilled.  ZOXWRU-0/4 systolic murmur heard, no prior identification of aortic stenosis.  Would like to characterize this better,  will repeat echocardiogram.  Nonurgent echocardiogram as murmur does not sound severe.  Total time of encounter: 30 minutes total time of encounter, including 23 minutes spent in face-to-face patient care on the date of this encounter. This time includes coordination of care and counseling regarding above mentioned problem list. Remainder of non-face-to-face time involved reviewing chart documents/testing relevant to the patient encounter and documentation in the medical record. I have independently reviewed documentation from referring provider.   Cherlynn Kaiser, MD [place]  [facility]    Medication Adjustments/Labs and Tests Ordered: Current medicines are reviewed at length with the patient today.  Concerns regarding medicines are outlined above.   Orders Placed This Encounter  Procedures  . EKG 12-Lead  . ECHOCARDIOGRAM COMPLETE    No orders of the defined types were placed in this encounter.   Patient Instructions  Medication Instructions:  No Changes In Medications at this time.  *If you  need a refill on your cardiac medications before your next appointment, please call your pharmacy*  Testing/Procedures: Your physician has requested that you have an echocardiogram. Echocardiography is a painless test that uses sound waves to create images of your heart. It provides your doctor with information about the size and shape of your heart and how well your heart's chambers and valves are working. You may receive an ultrasound enhancing agent through an IV if needed to better visualize your heart during the echo.This procedure takes approximately one hour. There are no restrictions for this procedure. This will take place at the [address], [address].   Follow-Up: At [facility], you and your health needs are our priority.  As part of our continuing mission to provide you with exceptional heart care, we have created designated Provider Care Teams.  These Care Teams  include your primary Cardiologist (physician) and Advanced Practice Providers (APPs -  Physician Assistants and Nurse Practitioners) who all work together to provide you with the care you need, when you need it.  Your next appointment:   6 month(s)  The format for your next appointment:   In Person  Provider:   Cherlynn Kaiser, MD

## 2020-06-10 NOTE — Patient Instructions (Signed)
Medication Instructions:  No Changes In Medications at this time.  *If you need a refill on your cardiac medications before your next appointment, please call your pharmacy*  Testing/Procedures: Your physician has requested that you have an echocardiogram. Echocardiography is a painless test that uses sound waves to create images of your heart. It provides your doctor with information about the size and shape of your heart and how well your heart's chambers and valves are working. You may receive an ultrasound enhancing agent through an IV if needed to better visualize your heart during the echo.This procedure takes approximately one hour. There are no restrictions for this procedure. This will take place at the [address], [address].   Follow-Up: At [facility], you and your health needs are our priority.  As part of our continuing mission to provide you with exceptional heart care, we have created designated Provider Care Teams.  These Care Teams include your primary Cardiologist (physician) and Advanced Practice Providers (APPs -  Physician Assistants and Nurse Practitioners) who all work together to provide you with the care you need, when you need it.  Your next appointment:   6 month(s)  The format for your next appointment:   In Person  Provider:   Cherlynn Kaiser, MD

## 2020-07-01 ENCOUNTER — Other Ambulatory Visit (HOSPITAL_COMMUNITY): Payer: BC Managed Care – PPO

## 2020-07-01 DIAGNOSIS — R011 Cardiac murmur, unspecified: Secondary | ICD-10-CM | POA: Diagnosis not present

## 2020-07-01 DIAGNOSIS — E1169 Type 2 diabetes mellitus with other specified complication: Secondary | ICD-10-CM | POA: Diagnosis not present

## 2020-07-01 DIAGNOSIS — Z6832 Body mass index (BMI) 32.0-32.9, adult: Secondary | ICD-10-CM | POA: Diagnosis not present

## 2020-07-01 DIAGNOSIS — L680 Hirsutism: Secondary | ICD-10-CM | POA: Diagnosis not present

## 2020-07-01 DIAGNOSIS — I10 Essential (primary) hypertension: Secondary | ICD-10-CM | POA: Diagnosis not present

## 2020-07-01 DIAGNOSIS — D649 Anemia, unspecified: Secondary | ICD-10-CM | POA: Diagnosis not present

## 2020-07-01 DIAGNOSIS — Z794 Long term (current) use of insulin: Secondary | ICD-10-CM | POA: Diagnosis not present

## 2020-07-01 DIAGNOSIS — N1832 Chronic kidney disease, stage 3b: Secondary | ICD-10-CM | POA: Diagnosis not present

## 2020-07-01 DIAGNOSIS — E782 Mixed hyperlipidemia: Secondary | ICD-10-CM | POA: Diagnosis not present

## 2020-07-05 ENCOUNTER — Telehealth: Payer: Self-pay | Admitting: Internal Medicine

## 2020-07-05 DIAGNOSIS — E1169 Type 2 diabetes mellitus with other specified complication: Secondary | ICD-10-CM | POA: Diagnosis not present

## 2020-07-05 DIAGNOSIS — I10 Essential (primary) hypertension: Secondary | ICD-10-CM | POA: Diagnosis not present

## 2020-07-05 DIAGNOSIS — N1832 Chronic kidney disease, stage 3b: Secondary | ICD-10-CM | POA: Diagnosis not present

## 2020-07-05 DIAGNOSIS — N183 Chronic kidney disease, stage 3 unspecified: Secondary | ICD-10-CM | POA: Diagnosis not present

## 2020-07-05 DIAGNOSIS — D509 Iron deficiency anemia, unspecified: Secondary | ICD-10-CM | POA: Diagnosis not present

## 2020-07-05 DIAGNOSIS — H35033 Hypertensive retinopathy, bilateral: Secondary | ICD-10-CM | POA: Diagnosis not present

## 2020-07-05 DIAGNOSIS — E782 Mixed hyperlipidemia: Secondary | ICD-10-CM | POA: Diagnosis not present

## 2020-07-05 MED ORDER — METOPROLOL SUCCINATE ER 25 MG PO TB24: 25.0000 mg | ORAL_TABLET | Freq: Every day | ORAL | 3 refills | Status: DC

## 2020-07-05 NOTE — Telephone Encounter (Signed)
Pt c/o medication issue:  1. Name of Medication: metoprolol succinate (TOPROL XL) 25 MG 24 hr tablet  2. How are you currently taking this medication (dosage and times per day)?  As written  3. Are you having a reaction (difficulty breathing--STAT)?  No   4. What is your medication issue? Patient needs a new prescription sent to [place], [place] - [address]

## 2020-07-05 NOTE — Telephone Encounter (Signed)
Returned a call to [employer] calling to verify Metoprolol directions.Advised patient takes Metoprolol Succ 25 mg daily.

## 2020-07-15 ENCOUNTER — Ambulatory Visit (HOSPITAL_COMMUNITY): Payer: BC Managed Care – PPO | Attending: Internal Medicine

## 2020-07-15 ENCOUNTER — Other Ambulatory Visit: Payer: Self-pay

## 2020-07-15 DIAGNOSIS — R011 Cardiac murmur, unspecified: Secondary | ICD-10-CM | POA: Diagnosis not present

## 2020-07-15 DIAGNOSIS — R079 Chest pain, unspecified: Secondary | ICD-10-CM | POA: Insufficient documentation

## 2020-07-15 DIAGNOSIS — I10 Essential (primary) hypertension: Secondary | ICD-10-CM | POA: Diagnosis not present

## 2020-07-15 LAB — ECHOCARDIOGRAM COMPLETE
Area-P 1/2: 2.87 cm2
S' Lateral: 2.1 cm

## 2020-07-25 ENCOUNTER — Ambulatory Visit
Admission: RE | Admit: 2020-07-25 | Discharge: 2020-07-25 | Disposition: A | Discharge status: Home / Self Care | Payer: BC Managed Care – PPO | Source: Ambulatory Visit | Attending: Family Medicine | Admitting: Family Medicine

## 2020-07-25 DIAGNOSIS — Z1231 Encounter for screening mammogram for malignant neoplasm of breast: Secondary | ICD-10-CM | POA: Diagnosis not present

## 2020-08-03 DIAGNOSIS — I10 Essential (primary) hypertension: Secondary | ICD-10-CM | POA: Diagnosis not present

## 2020-08-03 DIAGNOSIS — E1169 Type 2 diabetes mellitus with other specified complication: Secondary | ICD-10-CM | POA: Diagnosis not present

## 2020-08-03 DIAGNOSIS — N183 Chronic kidney disease, stage 3 unspecified: Secondary | ICD-10-CM | POA: Diagnosis not present

## 2020-08-03 DIAGNOSIS — E782 Mixed hyperlipidemia: Secondary | ICD-10-CM | POA: Diagnosis not present

## 2020-08-09 DIAGNOSIS — E119 Type 2 diabetes mellitus without complications: Secondary | ICD-10-CM | POA: Diagnosis not present

## 2020-08-09 DIAGNOSIS — Z961 Presence of intraocular lens: Secondary | ICD-10-CM | POA: Diagnosis not present

## 2020-08-09 DIAGNOSIS — H401133 Primary open-angle glaucoma, bilateral, severe stage: Secondary | ICD-10-CM | POA: Diagnosis not present

## 2020-08-09 DIAGNOSIS — H35031 Hypertensive retinopathy, right eye: Secondary | ICD-10-CM | POA: Diagnosis not present

## 2020-09-06 DIAGNOSIS — E782 Mixed hyperlipidemia: Secondary | ICD-10-CM | POA: Diagnosis not present

## 2020-09-06 DIAGNOSIS — D509 Iron deficiency anemia, unspecified: Secondary | ICD-10-CM | POA: Diagnosis not present

## 2020-09-06 DIAGNOSIS — E1169 Type 2 diabetes mellitus with other specified complication: Secondary | ICD-10-CM | POA: Diagnosis not present

## 2020-09-06 DIAGNOSIS — D649 Anemia, unspecified: Secondary | ICD-10-CM | POA: Diagnosis not present

## 2020-10-05 DIAGNOSIS — K317 Polyp of stomach and duodenum: Secondary | ICD-10-CM | POA: Diagnosis not present

## 2020-10-05 DIAGNOSIS — R1319 Other dysphagia: Secondary | ICD-10-CM | POA: Diagnosis not present

## 2020-10-05 DIAGNOSIS — Z8719 Personal history of other diseases of the digestive system: Secondary | ICD-10-CM | POA: Diagnosis not present

## 2020-10-05 DIAGNOSIS — R12 Heartburn: Secondary | ICD-10-CM | POA: Diagnosis not present

## 2020-10-05 DIAGNOSIS — Z8619 Personal history of other infectious and parasitic diseases: Secondary | ICD-10-CM | POA: Diagnosis not present

## 2020-10-26 DIAGNOSIS — D649 Anemia, unspecified: Secondary | ICD-10-CM | POA: Diagnosis not present

## 2020-10-26 DIAGNOSIS — E1169 Type 2 diabetes mellitus with other specified complication: Secondary | ICD-10-CM | POA: Diagnosis not present

## 2020-10-26 DIAGNOSIS — D509 Iron deficiency anemia, unspecified: Secondary | ICD-10-CM | POA: Diagnosis not present

## 2020-10-26 DIAGNOSIS — E782 Mixed hyperlipidemia: Secondary | ICD-10-CM | POA: Diagnosis not present

## 2020-11-01 DIAGNOSIS — I10 Essential (primary) hypertension: Secondary | ICD-10-CM | POA: Diagnosis not present

## 2020-11-01 DIAGNOSIS — E1169 Type 2 diabetes mellitus with other specified complication: Secondary | ICD-10-CM | POA: Diagnosis not present

## 2020-11-01 DIAGNOSIS — E782 Mixed hyperlipidemia: Secondary | ICD-10-CM | POA: Diagnosis not present

## 2020-11-01 DIAGNOSIS — N183 Chronic kidney disease, stage 3 unspecified: Secondary | ICD-10-CM | POA: Diagnosis not present

## 2020-11-21 ENCOUNTER — Other Ambulatory Visit: Payer: Self-pay | Admitting: Internal Medicine

## 2020-12-01 DIAGNOSIS — E1169 Type 2 diabetes mellitus with other specified complication: Secondary | ICD-10-CM | POA: Diagnosis not present

## 2020-12-01 DIAGNOSIS — I10 Essential (primary) hypertension: Secondary | ICD-10-CM | POA: Diagnosis not present

## 2020-12-01 DIAGNOSIS — E782 Mixed hyperlipidemia: Secondary | ICD-10-CM | POA: Diagnosis not present

## 2020-12-01 DIAGNOSIS — N183 Chronic kidney disease, stage 3 unspecified: Secondary | ICD-10-CM | POA: Diagnosis not present

## 2020-12-07 DIAGNOSIS — H35031 Hypertensive retinopathy, right eye: Secondary | ICD-10-CM | POA: Diagnosis not present

## 2020-12-07 DIAGNOSIS — H401133 Primary open-angle glaucoma, bilateral, severe stage: Secondary | ICD-10-CM | POA: Diagnosis not present

## 2020-12-07 DIAGNOSIS — E119 Type 2 diabetes mellitus without complications: Secondary | ICD-10-CM | POA: Diagnosis not present

## 2020-12-07 DIAGNOSIS — Z961 Presence of intraocular lens: Secondary | ICD-10-CM | POA: Diagnosis not present

## 2021-01-13 ENCOUNTER — Other Ambulatory Visit: Payer: Self-pay | Admitting: Internal Medicine

## 2021-01-19 DIAGNOSIS — K317 Polyp of stomach and duodenum: Secondary | ICD-10-CM | POA: Diagnosis not present

## 2021-01-19 DIAGNOSIS — K2289 Other specified disease of esophagus: Secondary | ICD-10-CM | POA: Diagnosis not present

## 2021-01-19 DIAGNOSIS — R1310 Dysphagia, unspecified: Secondary | ICD-10-CM | POA: Diagnosis not present

## 2021-01-19 DIAGNOSIS — K222 Esophageal obstruction: Secondary | ICD-10-CM | POA: Diagnosis not present

## 2021-01-19 DIAGNOSIS — K449 Diaphragmatic hernia without obstruction or gangrene: Secondary | ICD-10-CM | POA: Diagnosis not present

## 2021-01-20 DIAGNOSIS — E1169 Type 2 diabetes mellitus with other specified complication: Secondary | ICD-10-CM | POA: Diagnosis not present

## 2021-01-20 DIAGNOSIS — Z0000 Encounter for general adult medical examination without abnormal findings: Secondary | ICD-10-CM | POA: Diagnosis not present

## 2021-01-20 DIAGNOSIS — D649 Anemia, unspecified: Secondary | ICD-10-CM | POA: Diagnosis not present

## 2021-01-20 DIAGNOSIS — E782 Mixed hyperlipidemia: Secondary | ICD-10-CM | POA: Diagnosis not present

## 2021-01-20 DIAGNOSIS — Z23 Encounter for immunization: Secondary | ICD-10-CM | POA: Diagnosis not present

## 2021-01-20 DIAGNOSIS — I10 Essential (primary) hypertension: Secondary | ICD-10-CM | POA: Diagnosis not present

## 2021-01-20 DIAGNOSIS — Z1389 Encounter for screening for other disorder: Secondary | ICD-10-CM | POA: Diagnosis not present

## 2021-01-24 ENCOUNTER — Telehealth: Payer: Self-pay | Admitting: Internal Medicine

## 2021-01-24 MED ORDER — HYDRALAZINE HCL 25 MG PO TABS: 25.0000 mg | ORAL_TABLET | Freq: Two times a day (BID) | ORAL | 1 refills | Status: AC

## 2021-01-24 NOTE — Telephone Encounter (Signed)
New Message:     Jamie Payne from [facility] called. She wants to know if patient is still on Hydralazine? If so pt will need prescription called in please.    *STAT* If patient is at the pharmacy, call can be transferred to refill team.   1. Which medications need to be refilled? (please list name of each medication and dose if known)  prescription for Hydralazine  2. Which pharmacy/location (including street and city if local pharmacy) is medication to be sent to? [facility]  3. Do they need a 30 day or 90 day supply? 90 days and refills

## 2021-01-24 NOTE — Telephone Encounter (Signed)
REFILL DONE . NOTE SENT TO PT IS DUE FOR AN APPOINTMENT .Jamie Payne

## 2021-01-27 DIAGNOSIS — H35033 Hypertensive retinopathy, bilateral: Secondary | ICD-10-CM | POA: Diagnosis not present

## 2021-01-27 DIAGNOSIS — E1169 Type 2 diabetes mellitus with other specified complication: Secondary | ICD-10-CM | POA: Diagnosis not present

## 2021-01-27 DIAGNOSIS — D649 Anemia, unspecified: Secondary | ICD-10-CM | POA: Diagnosis not present

## 2021-01-27 DIAGNOSIS — E782 Mixed hyperlipidemia: Secondary | ICD-10-CM | POA: Diagnosis not present

## 2021-01-27 DIAGNOSIS — I10 Essential (primary) hypertension: Secondary | ICD-10-CM | POA: Diagnosis not present

## 2021-01-27 DIAGNOSIS — D509 Iron deficiency anemia, unspecified: Secondary | ICD-10-CM | POA: Diagnosis not present

## 2021-01-27 DIAGNOSIS — N1832 Chronic kidney disease, stage 3b: Secondary | ICD-10-CM | POA: Diagnosis not present

## 2021-02-26 DIAGNOSIS — E1169 Type 2 diabetes mellitus with other specified complication: Secondary | ICD-10-CM | POA: Diagnosis not present

## 2021-02-26 DIAGNOSIS — N1832 Chronic kidney disease, stage 3b: Secondary | ICD-10-CM | POA: Diagnosis not present

## 2021-02-26 DIAGNOSIS — I10 Essential (primary) hypertension: Secondary | ICD-10-CM | POA: Diagnosis not present

## 2021-02-26 DIAGNOSIS — E782 Mixed hyperlipidemia: Secondary | ICD-10-CM | POA: Diagnosis not present

## 2021-03-03 ENCOUNTER — Other Ambulatory Visit: Payer: Self-pay | Admitting: Internal Medicine

## 2021-04-03 DIAGNOSIS — I10 Essential (primary) hypertension: Secondary | ICD-10-CM | POA: Diagnosis not present

## 2021-04-03 DIAGNOSIS — N1832 Chronic kidney disease, stage 3b: Secondary | ICD-10-CM | POA: Diagnosis not present

## 2021-04-03 DIAGNOSIS — N183 Chronic kidney disease, stage 3 unspecified: Secondary | ICD-10-CM | POA: Diagnosis not present

## 2021-04-03 DIAGNOSIS — E1169 Type 2 diabetes mellitus with other specified complication: Secondary | ICD-10-CM | POA: Diagnosis not present

## 2021-04-12 DIAGNOSIS — E119 Type 2 diabetes mellitus without complications: Secondary | ICD-10-CM | POA: Diagnosis not present

## 2021-04-12 DIAGNOSIS — H401133 Primary open-angle glaucoma, bilateral, severe stage: Secondary | ICD-10-CM | POA: Diagnosis not present

## 2021-04-12 DIAGNOSIS — H35031 Hypertensive retinopathy, right eye: Secondary | ICD-10-CM | POA: Diagnosis not present

## 2021-04-12 DIAGNOSIS — Z961 Presence of intraocular lens: Secondary | ICD-10-CM | POA: Diagnosis not present

## 2021-05-02 DIAGNOSIS — Z8719 Personal history of other diseases of the digestive system: Secondary | ICD-10-CM | POA: Diagnosis not present

## 2021-05-02 DIAGNOSIS — R12 Heartburn: Secondary | ICD-10-CM | POA: Diagnosis not present

## 2021-05-02 DIAGNOSIS — R152 Fecal urgency: Secondary | ICD-10-CM | POA: Diagnosis not present

## 2021-05-25 DIAGNOSIS — N1832 Chronic kidney disease, stage 3b: Secondary | ICD-10-CM | POA: Diagnosis not present

## 2021-05-25 DIAGNOSIS — H35033 Hypertensive retinopathy, bilateral: Secondary | ICD-10-CM | POA: Diagnosis not present

## 2021-05-25 DIAGNOSIS — E782 Mixed hyperlipidemia: Secondary | ICD-10-CM | POA: Diagnosis not present

## 2021-05-25 DIAGNOSIS — E1169 Type 2 diabetes mellitus with other specified complication: Secondary | ICD-10-CM | POA: Diagnosis not present

## 2021-05-25 DIAGNOSIS — D649 Anemia, unspecified: Secondary | ICD-10-CM | POA: Diagnosis not present

## 2021-05-25 DIAGNOSIS — I10 Essential (primary) hypertension: Secondary | ICD-10-CM | POA: Diagnosis not present

## 2021-05-29 ENCOUNTER — Other Ambulatory Visit: Payer: Self-pay | Admitting: Internal Medicine

## 2021-06-16 ENCOUNTER — Telehealth: Payer: Self-pay | Admitting: Internal Medicine

## 2021-06-16 NOTE — Telephone Encounter (Signed)
Spoke to patient she stated she has been having upper left arm pain that radiates into left chest.Pain is intermittent for the past 1 month.Appointment scheduled with Laurann Montana NP 2/24 at 1:55 pm at [facility] location.

## 2021-06-16 NOTE — Telephone Encounter (Signed)
Patient is having twitches in her left arm that are painful and last for a breif amount of time. Regular Dr. thinks that she may have sprained her arm but pt says that it is not a sprain.

## 2021-06-21 NOTE — Progress Notes (Signed)
Office Visit    Patient Name: Jamie Payne Date of Encounter: 06/23/2021  PCP:  Antony Contras, MD   [facility]  Cardiologist:  Elouise Munroe, MD  Advanced Practice Provider:  No care team member to display Electrophysiologist:  None   HPI    Jamie Payne is a 71 y.o. female with a hx of glaucoma, hypertension, diabetes mellitus, and family history of heart disease presents today for evaluation of chest pain.  She had a previous CT coronary angiogram which showed nonobstructive CAD as well as a stress Myoview which was negative.  She had an echocardiogram in 2020 demonstrated normal LV function, hyperdynamic EF greater than 65%, moderate concentric LVH, and grade 1 DD with increased LV filling pressures.  She was seen by Dr. Margaretann Loveless February 2022 due to a recent ED visit where she felt dizzy and washed out, disoriented.  Her husband said she looked gray.  This was felt to be due to dehydration and rehydrated at home with water and Gatorade and felt back to normal.  She had no episode of presyncope or syncope in the past year.  Her conduction system delay was discussed and she was asked to call our office if she had recurrent dizziness, presyncope, or a syncopal episode.  With syncope should consider going to the ER.  Blood pressure was elevated in the ER but relatively stable at home.  Today, she states that she had some left pain under her arm that radiated to her chest. She states her heart was beating fast and vigorously and she had to lie down for a few minutes. Once the heart rate returned to normal, her pain subsided without further treatment. We discussed her family history where her brother died to a "massive heart attack" and her son was recently diagnosed with CHF. She also has a history of hyperlipidemia and hypertension. She has been compliant with all her medications. She has not had any SOB, lightheadedness/dizziness, syncope or  presyncope.  Reports no shortness of breath nor dyspnea on exertion.  No edema, orthopnea, PND. Reports no palpitations.    Past Medical History    Past Medical History:  Diagnosis Date   Glaucoma    Hypertension    Low hemoglobin    Past Surgical History:  Procedure Laterality Date   BILATERAL SALPINGECTOMY     BREAST BIOPSY Left 01/02/2017   negative   COLONOSCOPY N/A 12/04/2013   Procedure: COLONOSCOPY;  Surgeon: Missy Sabins, MD;  Location: [place];  Service: Endoscopy;  Laterality: N/A;   ESOPHAGOGASTRODUODENOSCOPY N/A 12/05/2013   Procedure: ESOPHAGOGASTRODUODENOSCOPY (EGD);  Surgeon: Jeryl Columbia, MD;  Location: [facility] ENDOSCOPY;  Service: Endoscopy;  Laterality: N/A;   HERNIA REPAIR      Allergies  Allergies  Allergen Reactions   Lisinopril Anaphylaxis   Penicillins Anaphylaxis   Crestor [Rosuvastatin Calcium]    Influenza Vaccine Live Other (See Comments)    "caused me to get sick"     EKGs/Labs/Other Studies Reviewed:   The following studies were reviewed today:  Echocardiogram 07/15/2020  IMPRESSIONS     1. Left ventricular ejection fraction, by estimation, is 65 to 70%. The  left ventricle has normal function. The left ventricle has no regional  wall motion abnormalities. There is mild left ventricular hypertrophy.  Left ventricular diastolic parameters  are consistent with Grade I diastolic dysfunction (impaired relaxation).   2. Right ventricular systolic function is normal. The right ventricular  size is normal.  3. The mitral valve is abnormal. Mild mitral valve regurgitation.   4. The aortic valve is tricuspid. Aortic valve regurgitation is not  visualized.   5. The inferior vena cava is normal in size with greater than 50%  respiratory variability, suggesting right atrial pressure of 3 mmHg.   Comparison(s): 08/06/18 EF >65%  Coronary CT  12/10/2018  FINDINGS: Coronary calcium score: The patient's coronary artery calcium score is 5.87,  which places the patient in the 59th percentile.   Coronary arteries: Normal coronary origins.  Right dominance.   Right Coronary Artery: Dominant vessel. There is a mild non-calcified stenosis (1-24% - CADRADS1) of the proximal RCA, just distal to the takeoff of the AV nodal branch.   Left Main Coronary Artery: Short vessel, no significant stenosis   Left Anterior Descending Coronary Artery: Mild 1-24% mixed plaque of the proximal LAD (CADRADS1) and distal LAD, non-obstructive   Left Circumflex Artery: Gives off a large OM vessel - no significant obstruction   Aorta: Normal size, 30 mm at the mid ascending aorta (level of the PA bifurcation) measured double oblique. Calcification is noted. No dissection.   Aortic Valve: Trileaflet.  Calcified annulus.   Other findings:   Normal pulmonary vein drainage into the left atrium.   Normal left atrial appendage without a thrombus.   Normal size of the pulmonary artery.   IMPRESSION: 1. Mild non-obstructive (1-24%) mixed plaque of the proximal and distal LAD, CADRADS = 1.   2. Coronary calcium score of 5.87. This was 59th percentile for age and sex matched control.   3. Normal coronary origin with right dominance.   4. Aortic annular calcification..   EKG:  EKG is  ordered today.  The ekg ordered today demonstrates chronic RBBB, NSR 78 bpm  Recent Labs: No results found for requested labs within last 8760 hours.  Recent Lipid Panel    Component Value Date/Time   CHOL 186 11/28/2018 0849   TRIG 196 (H) 11/28/2018 0849   HDL 40 11/28/2018 0849   CHOLHDL 4.7 (H) 11/28/2018 0849   LDLCALC 107 (H) 11/28/2018 0849   Home Medications   Current Meds  Medication Sig   amLODipine (NORVASC) 10 MG tablet Take 10 mg by mouth daily.   cloNIDine (CATAPRES) 0.2 MG tablet Take 1 tablet by mouth daily.   dorzolamide-timolol (COSOPT) 22.3-6.8 MG/ML ophthalmic solution Place 1 drop into the left eye 2 (two) times daily.   hydrALAZINE  (APRESOLINE) 25 MG tablet Take 1 tablet (25 mg total) by mouth 2 (two) times daily. PLEASE CALL OUR OFFICE FOR AN APPOINTMENT  WAS DUE TO BE SEEN IN AUGUST   insulin glargine, 1 Unit Dial, (TOUJEO SOLOSTAR) 300 UNIT/ML Solostar Pen Inject 30 Units into the skin daily.   meloxicam (MOBIC) 15 MG tablet Take 15 mg by mouth daily as needed for pain.    metoprolol succinate (TOPROL XL) 25 MG 24 hr tablet Take 1 tablet (25 mg total) by mouth daily.   nitroGLYCERIN (NITROSTAT) 0.4 MG SL tablet Place 1 tablet (0.4 mg total) under the tongue every 5 (five) minutes as needed for chest pain.   pantoprazole (PROTONIX) 40 MG tablet Take 1 tablet by mouth as needed.   rosuvastatin (CRESTOR) 5 MG tablet Take 5 mg by mouth daily.   [DISCONTINUED] TOUJEO SOLOSTAR 300 UNIT/ML SOPN INJECT 28 UNITS ONCE A DAY     Review of Systems      All other systems reviewed and are otherwise negative except as noted above.  Physical Exam    VS:  BP 130/80 (BP Location: Left Arm, Patient Position: Sitting, Cuff Size: Large)    Pulse 78    Ht 5\' 6"  (1.676 m)    Wt 216 lb 6.4 oz (98.2 kg)    BMI 34.93 kg/m  , BMI Body mass index is 34.93 kg/m.  Wt Readings from Last 3 Encounters:  06/23/21 216 lb 6.4 oz (98.2 kg)  06/10/20 211 lb 6.4 oz (95.9 kg)  07/27/19 219 lb (99.3 kg)     GEN: Well nourished, well developed, in no acute distress. HEENT: normal. Neck: Supple, no JVD, carotid bruits, or masses. Cardiac: RRR, no murmurs, rubs, or gallops. No clubbing, cyanosis, edema.  Radials/PT 2+ and equal bilaterally.  Respiratory:  Respirations regular and unlabored, clear to auscultation bilaterally. GI: Soft, nontender, nondistended. MS: No deformity or atrophy. Skin: Warm and dry, no rash. Neuro:  Strength and sensation are intact. Psych: Normal affect.  Assessment & Plan    Essential hypertension -Well controlled today in the clinic, 130/80 -Would recommend taking BP at home -Continue current medication regimen:  Norvasc 10mg , Toprol XL 25 mg  Chest pain -Called with upper left arm pain that radiates into her left chest  -Happened once and lasted a few minutes -Will order Lexiscan Myoview to protect her kidneys. Last creatinine 1.47 -Will provide PRN nitro prescription   Hyperlipidemia -LDL 51, triglycerides 154 -Continue Crestor -annual surveillance with lipid panel   Murmur -faint on exam -Mild MR on echocardiogram 06/2020 -denies SOB, lightheadedness, syncope, or presyncope    Disposition: Follow up 4-6 weeks with Elouise Munroe, MD or APP.  Signed, Elgie Collard, PA-C 06/23/2021, 2:19 PM [facility]

## 2021-06-23 ENCOUNTER — Other Ambulatory Visit: Payer: Self-pay

## 2021-06-23 ENCOUNTER — Ambulatory Visit (INDEPENDENT_AMBULATORY_CARE_PROVIDER_SITE_OTHER): Payer: BC Managed Care – PPO | Admitting: Physician Assistant

## 2021-06-23 ENCOUNTER — Telehealth (HOSPITAL_COMMUNITY): Payer: Self-pay | Admitting: *Deleted

## 2021-06-23 ENCOUNTER — Encounter (HOSPITAL_BASED_OUTPATIENT_CLINIC_OR_DEPARTMENT_OTHER): Payer: Self-pay | Admitting: Physician Assistant

## 2021-06-23 ENCOUNTER — Encounter (HOSPITAL_BASED_OUTPATIENT_CLINIC_OR_DEPARTMENT_OTHER): Payer: Self-pay

## 2021-06-23 VITALS — BP 130/80 | HR 78 | Ht 66.0 in | Wt 216.4 lb

## 2021-06-23 DIAGNOSIS — E785 Hyperlipidemia, unspecified: Secondary | ICD-10-CM | POA: Diagnosis not present

## 2021-06-23 DIAGNOSIS — R011 Cardiac murmur, unspecified: Secondary | ICD-10-CM | POA: Diagnosis not present

## 2021-06-23 DIAGNOSIS — R079 Chest pain, unspecified: Secondary | ICD-10-CM

## 2021-06-23 DIAGNOSIS — I10 Essential (primary) hypertension: Secondary | ICD-10-CM

## 2021-06-23 MED ORDER — NITROGLYCERIN 0.4 MG SL SUBL: 0.4000 mg | SUBLINGUAL_TABLET | SUBLINGUAL | 4 refills | Status: AC | PRN

## 2021-06-23 NOTE — Addendum Note (Signed)
Addended by: Gerald Stabs on: 06/23/2021 02:48 PM   Modules accepted: Orders

## 2021-06-23 NOTE — Patient Instructions (Signed)
Medication Instructions:  Your physician has recommended you make the following change in your medication:   Start: Nitroglycerin as needed   For as needed Nitroglycerin, if you develop chest pain: Sit and rest 5 minutes. If chest pain does not resolve place 1 nitroglycerin under your tongue and wait 5 minutes. If chest pain does not resolve, place a 2nd nitroglycerin under your tongue and wait 5 more minutes. If chest pain does not resolve, place a 3rd nitroglycerin under your tongue and seek emergency services.   *If you need a refill on your cardiac medications before your next appointment, please call your pharmacy*  Testing/Procedures: Your physician has requested that you have a lexiscan myoview. For further information please visit [email or web address]. Please follow instruction sheet, as given.   Follow-Up: At [facility], you and your health needs are our priority.  As part of our continuing mission to provide you with exceptional heart care, we have created designated Provider Care Teams.  These Care Teams include your primary Cardiologist (physician) and Advanced Practice Providers (APPs -  Physician Assistants and Nurse Practitioners) who all work together to provide you with the care you need, when you need it.  We recommend signing up for the patient portal called "MyChart".  Sign up information is provided on this After Visit Summary.  MyChart is used to connect with patients for Virtual Visits (Telemedicine).  Patients are able to view lab/test results, encounter notes, upcoming appointments, etc.  Non-urgent messages can be sent to your provider as well.   To learn more about what you can do with MyChart, go to [email or web address].    Your next appointment:   1 month(s)  The format for your next appointment:   In Person  Provider:   Elouise Munroe, MD  or APP{  Other Instructions: None today

## 2021-06-23 NOTE — Telephone Encounter (Signed)
Close encounter 

## 2021-06-23 NOTE — Addendum Note (Signed)
Addended by: Elgie Collard on: 06/23/2021 03:19 PM   Modules accepted: Orders

## 2021-06-26 NOTE — Addendum Note (Signed)
Addended by: Ernie Hew D on: 06/26/2021 12:04 PM   Modules accepted: Orders

## 2021-06-28 ENCOUNTER — Ambulatory Visit (HOSPITAL_COMMUNITY)
Admission: RE | Admit: 2021-06-28 | Discharge: 2021-06-28 | Disposition: A | Discharge status: Home / Self Care | Payer: BC Managed Care – PPO | Source: Ambulatory Visit | Attending: Cardiovascular Disease | Admitting: Cardiovascular Disease

## 2021-06-28 ENCOUNTER — Other Ambulatory Visit: Payer: Self-pay

## 2021-06-28 DIAGNOSIS — R079 Chest pain, unspecified: Secondary | ICD-10-CM | POA: Diagnosis not present

## 2021-06-28 MED ORDER — REGADENOSON 0.4 MG/5ML IV SOLN
0.4000 mg | Freq: Once | INTRAVENOUS | Status: AC
  Administered 2021-06-28: 0.4 mg via INTRAVENOUS

## 2021-06-28 MED ORDER — TECHNETIUM TC 99M TETROFOSMIN IV KIT
10.8000 | PACK | Freq: Once | INTRAVENOUS | Status: AC | PRN
  Administered 2021-06-28: 10.8 via INTRAVENOUS
  Filled 2021-06-28: qty 11

## 2021-06-28 MED ORDER — AMINOPHYLLINE 25 MG/ML IV SOLN
75.0000 mg | Freq: Once | INTRAVENOUS | Status: AC
  Administered 2021-06-28: 75 mg via INTRAVENOUS

## 2021-06-28 MED ORDER — TECHNETIUM TC 99M TETROFOSMIN IV KIT
31.9000 | PACK | Freq: Once | INTRAVENOUS | Status: AC | PRN
Start: 2021-06-28 — End: 2021-06-28
  Administered 2021-06-28: 31.9 via INTRAVENOUS
  Filled 2021-06-28: qty 32

## 2021-06-29 LAB — MYOCARDIAL PERFUSION IMAGING
LV dias vol: 100 mL (ref 46–106)
LV sys vol: 40 mL
Nuc Stress EF: 60 %
Peak HR: 100 {beats}/min
Rest HR: 59 {beats}/min
Rest Nuclear Isotope Dose: 10.7 mCi
SDS: 0
SRS: 0
SSS: 0
ST Depression (mm): 0 mm
Stress Nuclear Isotope Dose: 31.9 mCi
TID: 1.12

## 2021-06-30 ENCOUNTER — Telehealth (HOSPITAL_BASED_OUTPATIENT_CLINIC_OR_DEPARTMENT_OTHER): Payer: Self-pay

## 2021-06-30 NOTE — Telephone Encounter (Addendum)
Left message for patient to call back ? ? ? ? ?----- Message from Elgie Collard, PA-C sent at 06/30/2021  7:45 AM EST ----- ?Please share with the patient:  ? ?Ms. Jamie Payne,  ? ?Your stress test came back normal. No evidence of EKG changes. Great result. If you have questions please let me know! ? ?Take care, ? ?Elgie Collard, PA-C ? ? ?

## 2021-07-21 ENCOUNTER — Ambulatory Visit (INDEPENDENT_AMBULATORY_CARE_PROVIDER_SITE_OTHER): Payer: BC Managed Care – PPO | Admitting: Family

## 2021-07-21 ENCOUNTER — Encounter (HOSPITAL_BASED_OUTPATIENT_CLINIC_OR_DEPARTMENT_OTHER): Payer: Self-pay | Admitting: Family

## 2021-07-21 ENCOUNTER — Other Ambulatory Visit: Payer: Self-pay

## 2021-07-21 VITALS — BP 124/62 | HR 97 | Ht 65.5 in | Wt 218.1 lb

## 2021-07-21 DIAGNOSIS — I25118 Atherosclerotic heart disease of native coronary artery with other forms of angina pectoris: Secondary | ICD-10-CM

## 2021-07-21 DIAGNOSIS — E785 Hyperlipidemia, unspecified: Secondary | ICD-10-CM | POA: Diagnosis not present

## 2021-07-21 DIAGNOSIS — I10 Essential (primary) hypertension: Secondary | ICD-10-CM

## 2021-07-21 NOTE — Progress Notes (Signed)
? ?Office Visit  ?  ?Patient Name: Jamie Payne ?Date of Encounter: 07/21/2021 ? ?PCP:  Antony Contras, MD ?  ?[place]  ?Cardiologist:  Elouise Munroe, MD  ?Advanced Practice Provider:  No care team member to display ?Electrophysiologist:  None  ?   ?Chief Complaint  ?  ?Jamie Payne is a 71 y.o. female with a hx of glaucoma, nonobstructive coronary disease, HTN, DM2 presents today for follow up after myoview.  ? ?Past Medical History  ?  ?Past Medical History:  ?Diagnosis Date  ? Glaucoma   ? Hypertension   ? Low hemoglobin   ? ?Past Surgical History:  ?Procedure Laterality Date  ? BILATERAL SALPINGECTOMY    ? BREAST BIOPSY Left 01/02/2017  ? negative  ? COLONOSCOPY N/A 12/04/2013  ? Procedure: COLONOSCOPY;  Surgeon: Missy Sabins, MD;  Location: [facility];  Service: Endoscopy;  Laterality: N/A;  ? ESOPHAGOGASTRODUODENOSCOPY N/A 12/05/2013  ? Procedure: ESOPHAGOGASTRODUODENOSCOPY (EGD);  Surgeon: Jeryl Columbia, MD;  Location[facility] ENDOSCOPY;  Service: Endoscopy;  Laterality: N/A;  ? HERNIA REPAIR    ? ? ?Allergies ? ?Allergies  ?Allergen Reactions  ? Lisinopril Anaphylaxis  ? Penicillins Anaphylaxis  ? Crestor [Rosuvastatin Calcium]   ? Influenza Vaccine Live Other (See Comments)  ?  "caused me to get sick"  ? ? ?History of Present Illness  ?  ?Jamie Payne is a 71 y.o. female with a hx of glaucoma, nonobstructive coronary disease, HTN, DM2 last seen 06/23/21. ? ?Prior cardiac CTA coronary calcium score of 5.87 with mild non calcified stenosis 1-24% of prox RCA and prox LAD. [place] Kitchen Echo 2020 with hyperdynamic EF >65%, moderate concentric LVH, gr1DD, increased LV filling pressures.  ? ?She was seen 06/23/21 noting chest pain. Subsequent lexiscan myoview 06/28/21 low risk study. She presents today for follow up and we reviewed lexiscan. She will get occasional tingle in her chest but no true chest pain. Reports no shortness of breath nor dyspnea on exertion.No edema, orthopnea, PND. Reports no  palpitations.  No formal exercise routine.  ? ?EKGs/Labs/Other Studies Reviewed:  ? ?The following studies were reviewed today: ?Myoview 06/28/21 ?  ?  The study is normal. The study is low risk. ?  No ST deviation was noted. ?  Left ventricular function is normal. End diastolic cavity size is normal. ?  Prior study available for comparison from 08/06/2018. No changes compared to prior study. ?  ? ?Echocardiogram 07/15/2020 ?  ?IMPRESSIONS  ? ? ? 1. Left ventricular ejection fraction, by estimation, is 65 to 70%. The  ?left ventricle has normal function. The left ventricle has no regional  ?wall motion abnormalities. There is mild left ventricular hypertrophy.  ?Left ventricular diastolic parameters  ?are consistent with Grade I diastolic dysfunction (impaired relaxation).  ? 2. Right ventricular systolic function is normal. The right ventricular  ?size is normal.  ? 3. The mitral valve is abnormal. Mild mitral valve regurgitation.  ? 4. The aortic valve is tricuspid. Aortic valve regurgitation is not  ?visualized.  ? 5. The inferior vena cava is normal in size with greater than 50%  ?respiratory variability, suggesting right atrial pressure of 3 mmHg.  ? ?Comparison(s): 08/06/18 EF >65% ?  ?Coronary CT  12/10/2018 ?  ?FINDINGS: ?Coronary calcium score: The patient's coronary artery calcium score ?is 5.87, which places the patient in the 59th percentile. ?  ?Coronary arteries: Normal coronary origins.  Right dominance. ?  ?Right Coronary Artery: Dominant vessel.  There is a mild ?non-calcified stenosis (1-24% - CADRADS1) of the proximal RCA, just ?distal to the takeoff of the AV nodal branch. ?  ?Left Main Coronary Artery: Short vessel, no significant stenosis ?  ?Left Anterior Descending Coronary Artery: Mild 1-24% mixed plaque of ?the proximal LAD (CADRADS1) and distal LAD, non-obstructive ?  ?Left Circumflex Artery: Gives off a large OM vessel - no significant ?obstruction ?  ?Aorta: Normal size, 30 mm at the mid  ascending aorta (level of the ?PA bifurcation) measured double oblique. Calcification is noted. No ?dissection. ?  ?Aortic Valve: Trileaflet.  Calcified annulus. ?  ?Other findings: ?  ?Normal pulmonary vein drainage into the left atrium. ?  ?Normal left atrial appendage without a thrombus. ?  ?Normal size of the pulmonary artery. ?  ?IMPRESSION: ?1. Mild non-obstructive (1-24%) mixed plaque of the proximal and ?distal LAD, CADRADS = 1. ?  ?2. Coronary calcium score of 5.87. This was 59th percentile for age ?and sex matched control. ?  ?3. Normal coronary origin with right dominance. ?  ?4. Aortic annular calcification..  ?  ? ?EKG:  No EKG today ? ?Recent Labs: ?No results found for requested labs within last 8760 hours.  ?Recent Lipid Panel ?   ?Component Value Date/Time  ? CHOL 186 11/28/2018 0849  ? TRIG 196 (H) 11/28/2018 0849  ? HDL 40 11/28/2018 0849  ? CHOLHDL 4.7 (H) 11/28/2018 0849  ? [place] 107 (H) 11/28/2018 0849  ? ? ?Risk Assessment/Calculations:  ?  ? ?Home Medications  ? ?Current Meds  ?Medication Sig  ? amLODipine (NORVASC) 10 MG tablet Take 10 mg by mouth daily.  ? cloNIDine (CATAPRES) 0.2 MG tablet Take 1 tablet by mouth daily.  ? dorzolamide-timolol (COSOPT) 22.3-6.8 MG/ML ophthalmic solution Place 1 drop into the left eye 2 (two) times daily.  ? hydrALAZINE (APRESOLINE) 25 MG tablet Take 1 tablet (25 mg total) by mouth 2 (two) times daily. PLEASE CALL OUR OFFICE FOR AN APPOINTMENT  ?WAS DUE TO BE SEEN IN AUGUST  ? insulin glargine, 1 Unit Dial, (TOUJEO SOLOSTAR) 300 UNIT/ML Solostar Pen Inject 30 Units into the skin daily.  ? meloxicam (MOBIC) 15 MG tablet Take 15 mg by mouth daily as needed for pain.   ? metoprolol succinate (TOPROL XL) 25 MG 24 hr tablet Take 1 tablet (25 mg total) by mouth daily.  ? nitroGLYCERIN (NITROSTAT) 0.4 MG SL tablet Place 1 tablet (0.4 mg total) under the tongue every 5 (five) minutes as needed for chest pain.  ? pantoprazole (PROTONIX) 40 MG tablet Take 1 tablet by  mouth as needed.  ? rosuvastatin (CRESTOR) 5 MG tablet Take 5 mg by mouth daily.  ?  ? ?Review of Systems  ?    ?All other systems reviewed and are otherwise negative except as noted above. ? ?Physical Exam  ?  ?VS:  BP 124/62 (BP Location: Right Arm, Patient Position: Sitting, Cuff Size: Large)   Pulse 97   Ht 5' 5.5" (1.664 m)   Wt 218 lb 1.6 oz (98.9 kg)   SpO2 96%   BMI 35.74 kg/m?  , BMI Body mass index is 35.74 kg/m?. ? ?Wt Readings from Last 3 Encounters:  ?07/21/21 218 lb 1.6 oz (98.9 kg)  ?06/28/21 216 lb (98 kg)  ?06/23/21 216 lb 6.4 oz (98.2 kg)  ?  ? ?GEN: Well nourished, well developed, in no acute distress. ?HEENT: normal. ?Neck: Supple, no JVD, carotid bruits, or masses. ?Cardiac: RRR, no murmurs, rubs, or gallops.  No clubbing, cyanosis, edema.  Radials/PT 2+ and equal bilaterally.  ?Respiratory:  Respirations regular and unlabored, clear to auscultation bilaterally. ?GI: Soft, nontender, nondistended. ?MS: No deformity or atrophy. ?Skin: Warm and dry, no rash. ?Neuro:  Strength and sensation are intact. ?Psych: Normal affect. ? ?Assessment & Plan  ?  ?CAD - Myoview 06/2021 low risk study. GDMT includes metoprolol, rosuvastatin, PRN nitroglycerin. No aspirin due to allergy. Heart healthy diet and regular cardiovascular exercise encouraged.   ? ?HTN  - BP well controlled. Continue current antihypertensive regimen.   ? ?HLD - 12/2020 LDL 51. Continue Rosuvsatatin.  ? ?Obesity - Weight loss via diet and exercise encouraged. Discussed the impact being overweight would have on cardiovascular risk.  ? ?DM2 - Continue to follow with PCP.  ? ? ?Disposition: Follow up in 6 month(s) with Elouise Munroe, MD or APP. ? ?Signed, ?Loel Dubonnet, NP ?07/21/2021, 2:44 PM ?[place] ?

## 2021-07-21 NOTE — Patient Instructions (Signed)
Medication Instructions:  ?Continue your current medications.  ? ?*If you need a refill on your cardiac medications before your next appointment, please call your pharmacy* ? ? ?Lab Work: ?None ordered today.  ? ?Testing/Procedures: ?Your stress test was low risk which is a good result! ? ? ?Follow-Up: ?At [facility], you and your health needs are our priority.  As part of our continuing mission to provide you with exceptional heart care, we have created designated Provider Care Teams.  These Care Teams include your primary Cardiologist (physician) and Advanced Practice Providers (APPs -  Physician Assistants and Nurse Practitioners) who all work together to provide you with the care you need, when you need it. ? ?We recommend signing up for the patient portal called "MyChart".  Sign up information is provided on this After Visit Summary.  MyChart is used to connect with patients for Virtual Visits (Telemedicine).  Patients are able to view lab/test results, encounter notes, upcoming appointments, etc.  Non-urgent messages can be sent to your provider as well.   ?To learn more about what you can do with MyChart, go to [email or web address].   ? ?Your next appointment:   ?6 month(s) ? ?The format for your next appointment:   ?In Person ? ?Provider:   ?Elouise Munroe, MD  ? ? ?Other Instructions ? ?Heart Healthy Diet Recommendations: ?A low-salt diet is recommended. Meats should be grilled, baked, or boiled. Avoid fried foods. Focus on lean protein sources like fish or chicken with vegetables and fruits. The American Heart Association is a Microbiologist!  American Heart Association Diet and Lifeystyle Recommendations   ? ?Exercise recommendations: ?The American Heart Association recommends 150 minutes of moderate intensity exercise weekly. ?Try 30 minutes of moderate intensity exercise 4-5 times per week. ?This could include walking, jogging, or swimming. ?  ?

## 2021-07-25 DIAGNOSIS — E1169 Type 2 diabetes mellitus with other specified complication: Secondary | ICD-10-CM | POA: Diagnosis not present

## 2021-07-25 DIAGNOSIS — Z794 Long term (current) use of insulin: Secondary | ICD-10-CM | POA: Diagnosis not present

## 2021-07-25 DIAGNOSIS — R011 Cardiac murmur, unspecified: Secondary | ICD-10-CM | POA: Diagnosis not present

## 2021-07-25 DIAGNOSIS — I25119 Atherosclerotic heart disease of native coronary artery with unspecified angina pectoris: Secondary | ICD-10-CM | POA: Diagnosis not present

## 2021-07-25 DIAGNOSIS — E782 Mixed hyperlipidemia: Secondary | ICD-10-CM | POA: Diagnosis not present

## 2021-07-25 DIAGNOSIS — I10 Essential (primary) hypertension: Secondary | ICD-10-CM | POA: Diagnosis not present

## 2021-07-25 DIAGNOSIS — D649 Anemia, unspecified: Secondary | ICD-10-CM | POA: Diagnosis not present

## 2021-07-25 DIAGNOSIS — L680 Hirsutism: Secondary | ICD-10-CM | POA: Diagnosis not present

## 2021-07-25 DIAGNOSIS — N1832 Chronic kidney disease, stage 3b: Secondary | ICD-10-CM | POA: Diagnosis not present

## 2021-07-31 ENCOUNTER — Other Ambulatory Visit: Payer: Self-pay

## 2021-07-31 MED ORDER — METOPROLOL SUCCINATE ER 25 MG PO TB24
25.0000 mg | ORAL_TABLET | Freq: Every day | ORAL | 3 refills | Status: DC
Start: 2021-07-31 — End: 2021-08-10

## 2021-08-10 ENCOUNTER — Telehealth (HOSPITAL_BASED_OUTPATIENT_CLINIC_OR_DEPARTMENT_OTHER): Payer: Self-pay | Admitting: Family

## 2021-08-10 MED ORDER — METOPROLOL SUCCINATE ER 25 MG PO TB24: 25.0000 mg | ORAL_TABLET | Freq: Every day | ORAL | 3 refills | Status: AC

## 2021-08-10 NOTE — Telephone Encounter (Signed)
?*  STAT* If patient is at the pharmacy, call can be transferred to refill team. ? ? ?1. Which medications need to be refilled? (please list name of each medication and dose if known) Metoprolol ? ?2. Which pharmacy/location (including street and city if local pharmacy) is medication to be sent to?[facility] ? ?3. Do they need a 30 day or 90 day supply? 90 days and refills ? ?

## 2021-08-18 DIAGNOSIS — E1169 Type 2 diabetes mellitus with other specified complication: Secondary | ICD-10-CM | POA: Diagnosis not present

## 2021-08-18 DIAGNOSIS — Z794 Long term (current) use of insulin: Secondary | ICD-10-CM | POA: Diagnosis not present

## 2021-10-01 DIAGNOSIS — B349 Viral infection, unspecified: Secondary | ICD-10-CM | POA: Diagnosis not present

## 2021-12-19 DIAGNOSIS — N1831 Chronic kidney disease, stage 3a: Secondary | ICD-10-CM | POA: Diagnosis not present

## 2021-12-19 DIAGNOSIS — E1122 Type 2 diabetes mellitus with diabetic chronic kidney disease: Secondary | ICD-10-CM | POA: Diagnosis not present

## 2021-12-19 DIAGNOSIS — I152 Hypertension secondary to endocrine disorders: Secondary | ICD-10-CM | POA: Diagnosis not present

## 2021-12-19 DIAGNOSIS — E785 Hyperlipidemia, unspecified: Secondary | ICD-10-CM | POA: Diagnosis not present

## 2021-12-19 DIAGNOSIS — K625 Hemorrhage of anus and rectum: Secondary | ICD-10-CM | POA: Diagnosis not present

## 2021-12-19 DIAGNOSIS — K317 Polyp of stomach and duodenum: Secondary | ICD-10-CM | POA: Diagnosis not present

## 2022-02-05 DIAGNOSIS — Z0000 Encounter for general adult medical examination without abnormal findings: Secondary | ICD-10-CM | POA: Diagnosis not present

## 2022-02-05 DIAGNOSIS — E1169 Type 2 diabetes mellitus with other specified complication: Secondary | ICD-10-CM | POA: Diagnosis not present

## 2022-02-05 DIAGNOSIS — I10 Essential (primary) hypertension: Secondary | ICD-10-CM | POA: Diagnosis not present

## 2022-02-05 DIAGNOSIS — Z1331 Encounter for screening for depression: Secondary | ICD-10-CM | POA: Diagnosis not present

## 2022-02-05 DIAGNOSIS — D649 Anemia, unspecified: Secondary | ICD-10-CM | POA: Diagnosis not present

## 2022-02-05 DIAGNOSIS — E782 Mixed hyperlipidemia: Secondary | ICD-10-CM | POA: Diagnosis not present

## 2022-03-05 ENCOUNTER — Telehealth: Payer: Self-pay

## 2022-03-05 NOTE — Telephone Encounter (Signed)
..     Pre-operative Risk Assessment    Patient Name: Jamie Payne  DOB: 1951-04-28 MRN: [id number]      Request for Surgical Clearance    Procedure:   endoscopy/colonoscopy  Date of Surgery:  Clearance 03/08/22                                 Surgeon:  DR Ronnette Juniper Surgeon's Group or Practice Name:  [facility] Phone number:  [phone] Fax number:  [phone]   Type of Clearance Requested:   - Medical    Type of Anesthesia:   PROPOFOL   Additional requests/questions:    Signed, Merry Lofty   03/05/2022, 10:00 AM

## 2022-03-05 NOTE — Telephone Encounter (Signed)
   Name: Jamie Payne  DOB: [birth year]  MRN: [id number]  Primary Cardiologist: Elouise Munroe, MD   Preoperative team, please contact this patient and set up a phone call appointment for further preoperative risk assessment. Please obtain consent and complete medication review. Thank you for your help.  I confirm that guidance regarding antiplatelet and oral anticoagulation therapy has been completed and, if necessary, noted below.  No medication request were made.   Deberah Pelton, NP 03/05/2022, 12:00 PM [place]

## 2022-03-05 NOTE — Telephone Encounter (Signed)
Left message for the patient to contact the office. 

## 2022-03-05 NOTE — Telephone Encounter (Signed)
Patient returned RN's call.  Patient states a message can be left at [phone].

## 2022-03-06 ENCOUNTER — Telehealth: Payer: Self-pay | Admitting: *Deleted

## 2022-03-06 ENCOUNTER — Ambulatory Visit: Payer: BC Managed Care – PPO | Attending: General Practice | Admitting: General Practice

## 2022-03-06 DIAGNOSIS — Z01810 Encounter for preprocedural cardiovascular examination: Secondary | ICD-10-CM | POA: Diagnosis not present

## 2022-03-06 NOTE — Progress Notes (Signed)
Virtual Visit via Telephone Note   Because of Jamie Payne's co-morbid illnesses, she is at least at moderate risk for complications without adequate follow up.  This format is felt to be most appropriate for this patient at this time.  The patient did not have access to video technology/had technical difficulties with video requiring transitioning to audio format only (telephone).  All issues noted in this document were discussed and addressed.  No physical exam could be performed with this format.  Please refer to the patient's chart for her consent to telehealth for Jamie Payne.  Evaluation Performed:  Preoperative cardiovascular risk assessment _____________   Date:  03/06/2022   Patient ID:  Jamie Payne, DOB 03-15-1951, MRN [id number] Patient Location:  Home Provider location:   Office  Primary Care Provider:  Antony Contras, Payne Primary Cardiologist:  Jamie Payne  Chief Complaint / Patient Profile   71 y.o. y/o female with a h/o HTN, acute renal failure, acute blood loss anemia, GI bleed who is pending EGD/colonoscopy and presents today for telephonic preoperative cardiovascular risk assessment.  Past Medical History    Past Medical History:  Diagnosis Date   Glaucoma    Hypertension    Low hemoglobin    Past Surgical History:  Procedure Laterality Date   BILATERAL SALPINGECTOMY     BREAST BIOPSY Left 01/02/2017   negative   COLONOSCOPY N/A 12/04/2013   Procedure: COLONOSCOPY;  Surgeon: Jamie Sabins, Payne;  Location: [place];  Service: Endoscopy;  Laterality: N/A;   ESOPHAGOGASTRODUODENOSCOPY N/A 12/05/2013   Procedure: ESOPHAGOGASTRODUODENOSCOPY (EGD);  Surgeon: Jamie Columbia, Payne;  Location: [facility] ENDOSCOPY;  Service: Endoscopy;  Laterality: N/A;   HERNIA REPAIR      Allergies  Allergies  Allergen Reactions   Aspirin Anaphylaxis   Lisinopril Anaphylaxis   Penicillins Anaphylaxis   Crestor [Rosuvastatin Calcium]    Influenza Vaccine Live  Other (See Comments)    "caused me to get sick"    History of Present Illness    Jamie Payne is a 71 y.o. female who presents via audio/video conferencing for a telehealth visit today.  Pt was last seen in cardiology clinic on  07/21/2021 by Jamie Payne.  At that time Jamie Payne was doing well .  The patient is now pending procedure as outlined above. Since her last visit, she remained stable from a cardiac standpoint.  Today she denies chest pain, shortness of breath, lower extremity edema, fatigue, palpitations, melena, hematuria, hemoptysis, diaphoresis, weakness, presyncope, syncope, orthopnea, and PND.    Home Medications    Prior to Admission medications   Medication Sig Start Date End Date Taking? Authorizing Provider  amLODipine (NORVASC) 10 MG tablet Take 10 mg by mouth daily.    Provider, Historical, Payne  cloNIDine (CATAPRES) 0.2 MG tablet Take 1 tablet by mouth daily. 12/15/18   Provider, Historical, Payne  dorzolamide-timolol (COSOPT) 22.3-6.8 MG/ML ophthalmic solution Place 1 drop into the left eye 2 (two) times daily. 05/10/18   Provider, Historical, Payne  hydrALAZINE (APRESOLINE) 25 MG tablet Take 1 tablet (25 mg total) by mouth 2 (two) times daily. PLEASE CALL OUR OFFICE FOR AN APPOINTMENT  WAS DUE TO BE SEEN IN AUGUST 01/24/21 03/06/22  Jamie Payne  insulin glargine, 1 Unit Dial, (TOUJEO SOLOSTAR) 300 UNIT/ML Solostar Pen Inject 30 Units into the skin daily.    Provider, Historical, Payne  JARDIANCE 10 MG TABS tablet Take 10 mg by mouth daily.  Provider, Historical, Payne  meloxicam (MOBIC) 15 MG tablet Take 15 mg by mouth daily as needed for pain.     Provider, Historical, Payne  metoprolol succinate (TOPROL XL) 25 MG 24 hr tablet Take 1 tablet (25 mg total) by mouth daily. 08/10/21   Jamie Payne  nitroGLYCERIN (NITROSTAT) 0.4 MG SL tablet Place 1 tablet (0.4 mg total) under the tongue every 5 (five) minutes as needed for chest pain. 06/23/21 03/06/22   Jamie Collard, PA-C  OZEMPIC, 0.25 OR 0.5 MG/DOSE, 2 MG/3ML SOPN Inject 0.5 mg into the skin once a week.    Provider, Historical, Payne  pantoprazole (PROTONIX) 40 MG tablet Take 1 tablet by mouth as needed. 10/05/20   Provider, Historical, Payne  rosuvastatin (CRESTOR) 5 MG tablet Take 5 mg by mouth daily. 11/26/18   Provider, Historical, Payne    Physical Exam    Vital Signs:  Jamie Payne does not have vital signs available for review today.  Given telephonic nature of communication, physical exam is limited. AAOx3. NAD. Normal affect.  Speech and respirations are unlabored.  Accessory Clinical Findings    None  Assessment & Plan    1.  Preoperative Cardiovascular Risk Assessment: EGD/colonoscopy, Jamie Payne physicians   Primary Cardiologist: Jamie Payne  Chart reviewed as part of pre-operative protocol coverage. Given past medical history and time since last visit, based on ACC/AHA guidelines, Jamie Payne would be at acceptable risk for the planned procedure without further cardiovascular testing.   Patient was advised that if she develops new symptoms prior to surgery to contact our office to arrange a follow-up appointment.  Jamie Payne verbalized understanding.  I will route this recommendation to the requesting party via Epic fax function and remove from pre-op pool.  Please call with questions.     Time:   Today, I have spent 5 minutes with the patient with telehealth technology discussing medical history, symptoms, and management plan.  Prior to her phone evaluation I spent greater than 10 minutes reviewing her past medical history and current cardiac medications.   Jamie Pelton, Payne  03/06/2022, 11:16 AM

## 2022-03-06 NOTE — Telephone Encounter (Signed)
Pt called back and has been scheduled for a tele pre op appt  add on today due to procedure date of 03/08/22. Tele appt today 3:20.  Med rec and consent are done.      Patient Consent for Virtual Visit        Jamie Payne has provided verbal consent on 03/06/2022 for a virtual visit (video or telephone).   CONSENT FOR VIRTUAL VISIT FOR:  Jamie Payne  By participating in this virtual visit I agree to the following:  I hereby voluntarily request, consent and authorize [facility] and its employed or contracted physicians, physician assistants, nurse practitioners or other licensed health care professionals (the Practitioner), to provide me with telemedicine health care services (the "Services") as deemed necessary by the treating Practitioner. I acknowledge and consent to receive the Services by the Practitioner via telemedicine. I understand that the telemedicine visit will involve communicating with the Practitioner through live audiovisual communication technology and the disclosure of certain medical information by electronic transmission. I acknowledge that I have been given the opportunity to request an in-person assessment or other available alternative prior to the telemedicine visit and am voluntarily participating in the telemedicine visit.  I understand that I have the right to withhold or withdraw my consent to the use of telemedicine in the course of my care at any time, without affecting my right to future care or treatment, and that the Practitioner or I may terminate the telemedicine visit at any time. I understand that I have the right to inspect all information obtained and/or recorded in the course of the telemedicine visit and may receive copies of available information for a reasonable fee.  I understand that some of the potential risks of receiving the Services via telemedicine include:  Delay or interruption in medical evaluation due to technological equipment  failure or disruption; Information transmitted may not be sufficient (e.g. poor resolution of images) to allow for appropriate medical decision making by the Practitioner; and/or  In rare instances, security protocols could fail, causing a breach of personal health information.  Furthermore, I acknowledge that it is my responsibility to provide information about my medical history, conditions and care that is complete and accurate to the best of my ability. I acknowledge that Practitioner's advice, recommendations, and/or decision may be based on factors not within their control, such as incomplete or inaccurate data provided by me or distortions of diagnostic images or specimens that may result from electronic transmissions. I understand that the practice of medicine is not an exact science and that Practitioner makes no warranties or guarantees regarding treatment outcomes. I acknowledge that a copy of this consent can be made available to me via my patient portal ([facility]), or I can request a printed copy by calling the office of [facility].    I understand that my insurance will be billed for this visit.   I have read or had this consent read to me. I understand the contents of this consent, which adequately explains the benefits and risks of the Services being provided via telemedicine.  I have been provided ample opportunity to ask questions regarding this consent and the Services and have had my questions answered to my satisfaction. I give my informed consent for the services to be provided through the use of telemedicine in my medical care

## 2022-03-06 NOTE — Telephone Encounter (Signed)
Pt called back and has been scheduled for a tele pre op appt  add on today due to procedure date of 03/08/22. Tele appt today 3:20.  Med rec and consent are done.

## 2022-03-06 NOTE — Telephone Encounter (Signed)
Patient returning call.

## 2022-03-08 DIAGNOSIS — K625 Hemorrhage of anus and rectum: Secondary | ICD-10-CM | POA: Diagnosis not present

## 2022-03-08 DIAGNOSIS — K648 Other hemorrhoids: Secondary | ICD-10-CM | POA: Diagnosis not present

## 2022-03-08 DIAGNOSIS — K317 Polyp of stomach and duodenum: Secondary | ICD-10-CM | POA: Diagnosis not present

## 2022-03-21 DIAGNOSIS — E1122 Type 2 diabetes mellitus with diabetic chronic kidney disease: Secondary | ICD-10-CM | POA: Diagnosis not present

## 2022-03-28 DIAGNOSIS — E1122 Type 2 diabetes mellitus with diabetic chronic kidney disease: Secondary | ICD-10-CM | POA: Diagnosis not present

## 2022-03-28 DIAGNOSIS — N1831 Chronic kidney disease, stage 3a: Secondary | ICD-10-CM | POA: Diagnosis not present

## 2022-03-28 DIAGNOSIS — E785 Hyperlipidemia, unspecified: Secondary | ICD-10-CM | POA: Diagnosis not present

## 2022-03-28 DIAGNOSIS — I152 Hypertension secondary to endocrine disorders: Secondary | ICD-10-CM | POA: Diagnosis not present

## 2022-08-09 DIAGNOSIS — E1169 Type 2 diabetes mellitus with other specified complication: Secondary | ICD-10-CM | POA: Diagnosis not present

## 2022-08-09 DIAGNOSIS — R011 Cardiac murmur, unspecified: Secondary | ICD-10-CM | POA: Diagnosis not present

## 2022-08-09 DIAGNOSIS — I10 Essential (primary) hypertension: Secondary | ICD-10-CM | POA: Diagnosis not present

## 2022-08-09 DIAGNOSIS — D649 Anemia, unspecified: Secondary | ICD-10-CM | POA: Diagnosis not present

## 2022-08-09 DIAGNOSIS — N1832 Chronic kidney disease, stage 3b: Secondary | ICD-10-CM | POA: Diagnosis not present

## 2022-08-09 DIAGNOSIS — L680 Hirsutism: Secondary | ICD-10-CM | POA: Diagnosis not present

## 2022-08-09 DIAGNOSIS — E782 Mixed hyperlipidemia: Secondary | ICD-10-CM | POA: Diagnosis not present

## 2022-08-09 DIAGNOSIS — I25119 Atherosclerotic heart disease of native coronary artery with unspecified angina pectoris: Secondary | ICD-10-CM | POA: Diagnosis not present

## 2022-08-14 ENCOUNTER — Other Ambulatory Visit (HOSPITAL_BASED_OUTPATIENT_CLINIC_OR_DEPARTMENT_OTHER): Payer: Self-pay | Admitting: Internal Medicine

## 2022-12-10 ENCOUNTER — Ambulatory Visit
Admission: RE | Admit: 2022-12-10 | Discharge: 2022-12-10 | Disposition: A | Discharge status: Home / Self Care | Payer: Medicare HMO | Source: Ambulatory Visit | Attending: Internal Medicine | Admitting: Internal Medicine

## 2022-12-10 VITALS — BP 137/85 | HR 84 | Temp 98.5°F | Resp 16

## 2022-12-10 DIAGNOSIS — R1032 Left lower quadrant pain: Secondary | ICD-10-CM

## 2022-12-10 DIAGNOSIS — K5732 Diverticulitis of large intestine without perforation or abscess without bleeding: Secondary | ICD-10-CM

## 2022-12-10 MED ORDER — CIPROFLOXACIN HCL 500 MG PO TABS: 500.0000 mg | ORAL_TABLET | Freq: Two times a day (BID) | ORAL | 0 refills | Status: DC

## 2022-12-10 MED ORDER — METRONIDAZOLE 500 MG PO TABS: 500.0000 mg | ORAL_TABLET | Freq: Two times a day (BID) | ORAL | 0 refills | Status: AC

## 2022-12-10 NOTE — Discharge Instructions (Addendum)
Start ciprofloxacin 500 mg twice daily and Flagyl 500 mg twice daily.  Be sure to take these antibiotics with food.  Stay on a bland diet.  Keep follow-up appointment with gastroenterology on Wednesday.  If symptoms worsen then go to the emergency room as more advanced imaging may be needed.

## 2022-12-10 NOTE — ED Provider Notes (Signed)
[facility]    CSN: [id number] Arrival date & time: 12/10/22  1329      History   Chief Complaint Chief Complaint  Patient presents with   Abdominal Pain    HPI Jamie Payne is a 72 y.o. female.   72 year old female who presents to urgent care with intermittent abdominal pain for about 2 months.  She reports that she has had this over the years where she will have a flareup once every several months but the last 2 months it has been more consistent with flares every 2 to 3 weeks.  Her flares used to last only a day or 2 and then would would resolve on its own.  Her flares now seem to be lasting much longer.  She gets more pain when she is attempting to have a bowel movement in the left lower quadrant and feels like she is getting sweats.  She denies any nausea or vomiting that is associated with this.  No melena, fevers, chills.  She does have a history of colon polyps that have been removed with her last colonoscopy being in November.  She was told that she has diverticulosis.  Her gastroenterologist is with [facility] however she has not available so the patient has scheduled an appointment to see a gastroenterologist at [facility] this Wednesday.     Abdominal Pain Associated symptoms: fatigue   Associated symptoms: no chest pain, no chills, no constipation, no cough, no diarrhea, no dysuria, no fever, no hematuria, no nausea, no shortness of breath, no sore throat and no vomiting     Past Medical History:  Diagnosis Date   Glaucoma    Hypertension    Low hemoglobin     Patient Active Problem List   Diagnosis Date Noted   Acute on chronic renal failure (HCC) 12/03/2013   GI bleed 12/02/2013   Acute blood loss anemia 12/02/2013   ARF (acute renal failure) (HCC) 12/02/2013   HTN (hypertension) 12/02/2013   Glaucoma 12/02/2013    Past Surgical History:  Procedure Laterality Date   BILATERAL SALPINGECTOMY     BREAST BIOPSY Left 01/02/2017    negative   COLONOSCOPY N/A 12/04/2013   Procedure: COLONOSCOPY;  Surgeon: Barrie Folk, MD;  Location: [facility] ENDOSCOPY;  Service: Endoscopy;  Laterality: N/A;   ESOPHAGOGASTRODUODENOSCOPY N/A 12/05/2013   Procedure: ESOPHAGOGASTRODUODENOSCOPY (EGD);  Surgeon: Petra Kuba, MD;  Location: [facility] ENDOSCOPY;  Service: Endoscopy;  Laterality: N/A;   HERNIA REPAIR      OB History   No obstetric history on file.      Home Medications    Prior to Admission medications   Medication Sig Start Date End Date Taking? Authorizing Provider  amLODipine (NORVASC) 10 MG tablet Take 10 mg by mouth daily.    [provider]  cloNIDine (CATAPRES) 0.2 MG tablet Take 1 tablet by mouth daily. 12/15/18   [provider]  dorzolamide-timolol (COSOPT) 22.3-6.8 MG/ML ophthalmic solution Place 1 drop into the left eye 2 (two) times daily. 05/10/18   [provider]  hydrALAZINE (APRESOLINE) 25 MG tablet Take 1 tablet (25 mg total) by mouth 2 (two) times daily. PLEASE CALL OUR OFFICE FOR AN APPOINTMENT  WAS DUE TO BE SEEN IN AUGUST 01/24/21 03/06/22  Parke Poisson, MD  insulin glargine, 1 Unit Dial, (TOUJEO SOLOSTAR) 300 UNIT/ML Solostar Pen Inject 30 Units into the skin daily.    [provider]  JARDIANCE 10 MG TABS tablet Take 10 mg by  mouth daily.    [provider]  meloxicam (MOBIC) 15 MG tablet Take 15 mg by mouth daily as needed for pain.     [provider]  metoprolol succinate (TOPROL XL) 25 MG 24 hr tablet Take 1 tablet (25 mg total) by mouth daily. 08/10/21   Parke Poisson, MD  nitroGLYCERIN (NITROSTAT) 0.4 MG SL tablet Place 1 tablet (0.4 mg total) under the tongue every 5 (five) minutes as needed for chest pain. 06/23/21 03/06/22  Sharlene Dory, PA-C  OZEMPIC, 0.25 OR 0.5 MG/DOSE, 2 MG/3ML SOPN Inject 0.5 mg into the skin once a week.    [provider]  pantoprazole (PROTONIX) 40 MG tablet Take 1 tablet by mouth as needed. 10/05/20   [provider]  rosuvastatin (CRESTOR) 5 MG tablet Take 5 mg by mouth daily. 11/26/18   [provider]    Family History Family History  Problem Relation Age of Onset   Hypertension Mother    Diabetes Mother    Breast cancer Maternal Grandmother    Breast cancer Paternal Grandmother    Hypertension Father    Renal Disease Father    Pancreatic cancer Brother    Bipolar disorder Daughter    Bipolar disorder Daughter    Pancreatic cancer Brother     Social History Social History   Tobacco Use   Smoking status: Never   Smokeless tobacco: Never  Vaping Use   Vaping status: Never Used  Substance Use Topics   Alcohol use: No   Drug use: No     Allergies   Aspirin, Lisinopril, Penicillins, Crestor [rosuvastatin calcium], and Influenza vaccine live   Review of Systems Review of Systems  Constitutional:  Positive for appetite change and fatigue. Negative for chills and fever.  HENT:  Negative for ear pain and sore throat.   Eyes:  Negative for pain and visual disturbance.  Respiratory:  Negative for cough and shortness of breath.   Cardiovascular:  Negative for chest pain and palpitations.  Gastrointestinal:  Positive for abdominal distention and abdominal pain. Negative for blood in stool, constipation, diarrhea, nausea, rectal pain and vomiting.  Genitourinary:  Negative for dysuria and hematuria.  Musculoskeletal:  Negative for arthralgias and back pain.  Skin:  Negative for color change and rash.  Neurological:  Negative for seizures and syncope.  All other systems reviewed and are negative.    Physical Exam Triage Vital Signs ED Triage Vitals  Encounter Vitals Group     BP 12/10/22 1356 137/85     Systolic BP Percentile --      Diastolic BP Percentile --      Pulse Rate 12/10/22 1356 84     Resp 12/10/22 1356 16     Temp 12/10/22 1356 98.5 F (36.9 C)     Temp Source 12/10/22 1356 Oral     SpO2 12/10/22 1356 96 %     Weight --      Height --       Head Circumference --      Peak Flow --      Pain Score 12/10/22 1354 0     Pain Loc --      Pain Education --      Exclude from Growth Chart --    No data found.  Updated Vital Signs BP 137/85 (BP Location: Right Arm)   Pulse 84   Temp 98.5 F (36.9 C) (Oral)   Resp 16   SpO2 96%  Visual Acuity Right Eye Distance:   Left Eye Distance:   Bilateral Distance:    Right Eye Near:   Left Eye Near:    Bilateral Near:     Physical Exam Vitals and nursing note reviewed.  Constitutional:      General: She is not in acute distress.    Appearance: She is well-developed.  HENT:     Head: Normocephalic and atraumatic.  Eyes:     Conjunctiva/sclera: Conjunctivae normal.  Cardiovascular:     Rate and Rhythm: Normal rate and regular rhythm.     Heart sounds: No murmur heard. Pulmonary:     Effort: Pulmonary effort is normal. No respiratory distress.     Breath sounds: Normal breath sounds.  Abdominal:     General: Abdomen is flat. Bowel sounds are normal. There is no abdominal bruit. There are no signs of injury.     Palpations: Abdomen is soft. There is no mass.     Tenderness: There is abdominal tenderness in the epigastric area and left lower quadrant. There is guarding. There is no right CVA tenderness, left CVA tenderness or rebound. Negative signs include Murphy's sign.     Hernia: There is no hernia in the ventral area.  Musculoskeletal:        General: No swelling.     Cervical back: Neck supple.  Skin:    General: Skin is warm and dry.     Capillary Refill: Capillary refill takes less than 2 seconds.  Neurological:     Mental Status: She is alert.  Psychiatric:        Mood and Affect: Mood normal.      UC Treatments / Results  Labs (all labs ordered are listed, but only abnormal results are displayed) Labs Reviewed - No data to display  EKG   Radiology No results found.  Procedures Procedures (including critical care time)  Medications Ordered in  UC Medications - No data to display  Initial Impression / Assessment and Plan / UC Course  I have reviewed the triage vital signs and the nursing notes.  Pertinent labs & imaging results that were available during my care of the patient were reviewed by me and considered in my medical decision making (see chart for details).     Possible diverticulitis with left lower quadrant pain and epigastric pain: Discussed with the patient at length that for complete diagnosis more advanced imaging would be needed for her abdomen.  At this time she does not appear to have a surgical abdomen and her vital signs are stable.  Given her history of diverticulosis I believe it is reasonable to start her on ciprofloxacin and metronidazole for possible diverticulitis.  She does have an appointment with gastroenterology on Wednesday which should help with a more definitive diagnosis.  Discussed with the patient that should her symptoms worsen before Wednesday she would need to go to the emergency room as more advanced imaging may be needed.  Patient expresses understanding.  Also advised that the patient keep a bland diet until her appointment. Final Clinical Impressions(s) / UC Diagnoses   Final diagnoses:  None   Discharge Instructions   None    ED Prescriptions   None    PDMP not reviewed this encounter.   Landis Martins, [place] 12/10/22 1456

## 2022-12-10 NOTE — ED Triage Notes (Signed)
Pt c/o intermittent abd pain, BM (denies as diarrhea), sweats after eating x 1 month-last episode 8/8 thru 8/1-NAD-slow gait

## 2022-12-21 DIAGNOSIS — R195 Other fecal abnormalities: Secondary | ICD-10-CM | POA: Diagnosis not present

## 2022-12-21 DIAGNOSIS — K529 Noninfective gastroenteritis and colitis, unspecified: Secondary | ICD-10-CM | POA: Diagnosis not present

## 2022-12-21 DIAGNOSIS — K2970 Gastritis, unspecified, without bleeding: Secondary | ICD-10-CM | POA: Diagnosis not present

## 2022-12-21 DIAGNOSIS — R1011 Right upper quadrant pain: Secondary | ICD-10-CM | POA: Diagnosis not present

## 2023-01-06 DIAGNOSIS — K2270 Barrett's esophagus without dysplasia: Secondary | ICD-10-CM | POA: Diagnosis not present

## 2023-01-10 DIAGNOSIS — E1122 Type 2 diabetes mellitus with diabetic chronic kidney disease: Secondary | ICD-10-CM | POA: Diagnosis not present

## 2023-02-16 DIAGNOSIS — R7401 Elevation of levels of liver transaminase levels: Secondary | ICD-10-CM | POA: Diagnosis not present

## 2023-02-18 ENCOUNTER — Other Ambulatory Visit: Payer: Self-pay | Admitting: Family Medicine

## 2023-02-18 DIAGNOSIS — E2839 Other primary ovarian failure: Secondary | ICD-10-CM

## 2023-02-25 ENCOUNTER — Other Ambulatory Visit: Payer: Self-pay | Admitting: Family Medicine

## 2023-02-25 DIAGNOSIS — Z1231 Encounter for screening mammogram for malignant neoplasm of breast: Secondary | ICD-10-CM

## 2023-03-22 ENCOUNTER — Ambulatory Visit
Admission: RE | Admit: 2023-03-22 | Discharge: 2023-03-22 | Disposition: A | Discharge status: Home / Self Care | Payer: Medicare HMO | Source: Ambulatory Visit | Attending: Family Medicine | Admitting: Family Medicine

## 2023-03-22 DIAGNOSIS — Z1231 Encounter for screening mammogram for malignant neoplasm of breast: Secondary | ICD-10-CM

## 2023-03-26 ENCOUNTER — Other Ambulatory Visit: Payer: Self-pay | Admitting: Family Medicine

## 2023-03-26 DIAGNOSIS — R928 Other abnormal and inconclusive findings on diagnostic imaging of breast: Secondary | ICD-10-CM

## 2023-04-17 ENCOUNTER — Other Ambulatory Visit: Payer: Medicare HMO

## 2023-05-01 DIAGNOSIS — K635 Polyp of colon: Secondary | ICD-10-CM | POA: Diagnosis not present

## 2023-05-07 ENCOUNTER — Ambulatory Visit
Admission: RE | Admit: 2023-05-07 | Discharge: 2023-05-07 | Disposition: A | Discharge status: Home / Self Care | Payer: Medicare HMO | Source: Ambulatory Visit | Attending: Family Medicine | Admitting: Family Medicine

## 2023-05-07 DIAGNOSIS — N6011 Diffuse cystic mastopathy of right breast: Secondary | ICD-10-CM | POA: Diagnosis not present

## 2023-05-07 DIAGNOSIS — R928 Other abnormal and inconclusive findings on diagnostic imaging of breast: Secondary | ICD-10-CM

## 2023-05-07 DIAGNOSIS — R92321 Mammographic fibroglandular density, right breast: Secondary | ICD-10-CM | POA: Diagnosis not present

## 2023-05-15 DIAGNOSIS — R1011 Right upper quadrant pain: Secondary | ICD-10-CM | POA: Diagnosis not present

## 2023-05-15 DIAGNOSIS — Z8719 Personal history of other diseases of the digestive system: Secondary | ICD-10-CM | POA: Diagnosis not present

## 2023-05-15 DIAGNOSIS — Z860100 Personal history of colon polyps, unspecified: Secondary | ICD-10-CM | POA: Diagnosis not present

## 2023-05-15 DIAGNOSIS — K760 Fatty (change of) liver, not elsewhere classified: Secondary | ICD-10-CM | POA: Diagnosis not present

## 2023-06-03 DIAGNOSIS — R1011 Right upper quadrant pain: Secondary | ICD-10-CM | POA: Diagnosis not present

## 2023-07-09 DIAGNOSIS — E1122 Type 2 diabetes mellitus with diabetic chronic kidney disease: Secondary | ICD-10-CM | POA: Diagnosis not present

## 2023-07-16 DIAGNOSIS — N1831 Chronic kidney disease, stage 3a: Secondary | ICD-10-CM | POA: Diagnosis not present

## 2023-07-16 DIAGNOSIS — E1122 Type 2 diabetes mellitus with diabetic chronic kidney disease: Secondary | ICD-10-CM | POA: Diagnosis not present

## 2023-07-16 DIAGNOSIS — E785 Hyperlipidemia, unspecified: Secondary | ICD-10-CM | POA: Diagnosis not present

## 2023-07-16 DIAGNOSIS — I152 Hypertension secondary to endocrine disorders: Secondary | ICD-10-CM | POA: Diagnosis not present

## 2023-07-24 DIAGNOSIS — Z8249 Family history of ischemic heart disease and other diseases of the circulatory system: Secondary | ICD-10-CM | POA: Diagnosis not present

## 2023-07-24 DIAGNOSIS — E669 Obesity, unspecified: Secondary | ICD-10-CM | POA: Diagnosis not present

## 2023-07-24 DIAGNOSIS — K760 Fatty (change of) liver, not elsewhere classified: Secondary | ICD-10-CM | POA: Diagnosis not present

## 2023-07-24 DIAGNOSIS — R011 Cardiac murmur, unspecified: Secondary | ICD-10-CM | POA: Diagnosis not present

## 2023-07-24 DIAGNOSIS — K2270 Barrett's esophagus without dysplasia: Secondary | ICD-10-CM | POA: Diagnosis not present

## 2023-07-24 DIAGNOSIS — K219 Gastro-esophageal reflux disease without esophagitis: Secondary | ICD-10-CM | POA: Diagnosis not present

## 2023-07-24 DIAGNOSIS — N189 Chronic kidney disease, unspecified: Secondary | ICD-10-CM | POA: Diagnosis not present

## 2023-07-24 DIAGNOSIS — E785 Hyperlipidemia, unspecified: Secondary | ICD-10-CM | POA: Diagnosis not present

## 2023-07-24 DIAGNOSIS — Z880 Allergy status to penicillin: Secondary | ICD-10-CM | POA: Diagnosis not present

## 2023-07-24 DIAGNOSIS — I25119 Atherosclerotic heart disease of native coronary artery with unspecified angina pectoris: Secondary | ICD-10-CM | POA: Diagnosis not present

## 2023-07-24 DIAGNOSIS — I129 Hypertensive chronic kidney disease with stage 1 through stage 4 chronic kidney disease, or unspecified chronic kidney disease: Secondary | ICD-10-CM | POA: Diagnosis not present

## 2023-07-24 DIAGNOSIS — E1122 Type 2 diabetes mellitus with diabetic chronic kidney disease: Secondary | ICD-10-CM | POA: Diagnosis not present

## 2023-07-24 DIAGNOSIS — Z7985 Long-term (current) use of injectable non-insulin antidiabetic drugs: Secondary | ICD-10-CM | POA: Diagnosis not present

## 2023-07-24 DIAGNOSIS — H409 Unspecified glaucoma: Secondary | ICD-10-CM | POA: Diagnosis not present

## 2023-07-24 DIAGNOSIS — M1990 Unspecified osteoarthritis, unspecified site: Secondary | ICD-10-CM | POA: Diagnosis not present

## 2023-07-24 DIAGNOSIS — Z888 Allergy status to other drugs, medicaments and biological substances status: Secondary | ICD-10-CM | POA: Diagnosis not present

## 2023-07-24 DIAGNOSIS — Z6828 Body mass index (BMI) 28.0-28.9, adult: Secondary | ICD-10-CM | POA: Diagnosis not present

## 2023-08-12 ENCOUNTER — Encounter (HOSPITAL_COMMUNITY): Payer: Self-pay

## 2023-08-12 ENCOUNTER — Other Ambulatory Visit: Payer: Self-pay

## 2023-08-12 ENCOUNTER — Emergency Department (HOSPITAL_COMMUNITY)
Admission: EM | Admit: 2023-08-12 | Discharge: 2023-08-12 | Disposition: A | Discharge status: Home / Self Care | Attending: Emergency Medicine | Admitting: Emergency Medicine

## 2023-08-12 DIAGNOSIS — Z794 Long term (current) use of insulin: Secondary | ICD-10-CM | POA: Insufficient documentation

## 2023-08-12 DIAGNOSIS — R55 Syncope and collapse: Secondary | ICD-10-CM | POA: Diagnosis not present

## 2023-08-12 DIAGNOSIS — E119 Type 2 diabetes mellitus without complications: Secondary | ICD-10-CM | POA: Diagnosis not present

## 2023-08-12 DIAGNOSIS — Z7984 Long term (current) use of oral hypoglycemic drugs: Secondary | ICD-10-CM | POA: Insufficient documentation

## 2023-08-12 LAB — URINALYSIS, ROUTINE W REFLEX MICROSCOPIC
Bacteria, UA: NONE SEEN
Bilirubin Urine: NEGATIVE
Glucose, UA: >=500 mg/dL — AB
Hgb urine dipstick: NEGATIVE
Ketones, ur: NEGATIVE mg/dL
Leukocytes,Ua: NEGATIVE
Nitrite: NEGATIVE
Protein, ur: NEGATIVE mg/dL
Specific Gravity, Urine: 1.01 (ref 1.005–1.030)
pH: 6 (ref 5.0–8.0)

## 2023-08-12 LAB — CBG MONITORING, ED: Glucose-Capillary: 153 mg/dL — ABNORMAL HIGH (ref 70–99)

## 2023-08-12 LAB — CBC
HCT: 39.7 % (ref 36.0–46.0)
Hemoglobin: 12.8 g/dL (ref 12.0–15.0)
MCH: 30.3 pg (ref 26.0–34.0)
MCHC: 32.2 g/dL (ref 30.0–36.0)
MCV: 94.1 fL (ref 80.0–100.0)
Platelets: 325 10*3/uL (ref 150–400)
RBC: 4.22 MIL/uL (ref 3.87–5.11)
RDW: 14.4 % (ref 11.5–15.5)
WBC: 4.8 10*3/uL (ref 4.0–10.5)
nRBC: 0.4 % — ABNORMAL HIGH (ref 0.0–0.2)

## 2023-08-12 LAB — BASIC METABOLIC PANEL WITH GFR
Anion gap: 11 (ref 5–15)
BUN: 17 mg/dL (ref 8–23)
CO2: 22 mmol/L (ref 22–32)
Calcium: 10.3 mg/dL (ref 8.9–10.3)
Chloride: 107 mmol/L (ref 98–111)
Creatinine, Ser: 1.48 mg/dL — ABNORMAL HIGH (ref 0.44–1.00)
GFR, Estimated: 37 mL/min — ABNORMAL LOW (ref >60)
Glucose, Bld: 143 mg/dL — ABNORMAL HIGH (ref 70–99)
Potassium: 3.6 mmol/L (ref 3.5–5.1)
Sodium: 140 mmol/L (ref 135–145)

## 2023-08-12 NOTE — ED Provider Notes (Signed)
 [place] EMERGENCY DEPARTMENT AT [facility] Provider Note   CSN: [id number] Arrival date & time: 08/12/23  1119     History  Chief Complaint  Patient presents with   Near Syncope    Jamie Payne is a 73 y.o. female.  HPI   73 year old female presents emergency department after an episode of feeling faint and sweaty.  Patient is diabetic, she took her morning meds but did not eat anything this morning.  Following this she had an episode where she was described as sweaty, lightheaded.  Did not have any syncope.  She quickly drank some of an orange soda and started to feel better.  The symptoms have resolved and she otherwise has no acute complaints.  She is compliant with her medications, no recent medication adjustments, no recent illness.  No chest pain, shortness of breath, neurodeficit.  Home Medications Prior to Admission medications   Medication Sig Start Date End Date Taking? Authorizing Provider  amLODipine (NORVASC) 10 MG tablet Take 10 mg by mouth daily.    [provider]  ciprofloxacin (CIPRO) 500 MG tablet Take 1 tablet (500 mg total) by mouth 2 (two) times daily. 12/10/22   White, Elizabeth A, PA-C  cloNIDine (CATAPRES) 0.2 MG tablet Take 1 tablet by mouth daily. 12/15/18   [provider]  dorzolamide-timolol (COSOPT) 22.3-6.8 MG/ML ophthalmic solution Place 1 drop into the left eye 2 (two) times daily. 05/10/18   [provider]  hydrALAZINE (APRESOLINE) 25 MG tablet Take 1 tablet (25 mg total) by mouth 2 (two) times daily. PLEASE CALL OUR OFFICE FOR AN APPOINTMENT  WAS DUE TO BE SEEN IN AUGUST 01/24/21 03/06/22  Parke Poisson, MD  insulin glargine, 1 Unit Dial, (TOUJEO SOLOSTAR) 300 UNIT/ML Solostar Pen Inject 30 Units into the skin daily.    [provider]  JARDIANCE 10 MG TABS tablet Take 10 mg by mouth daily.    [provider]  meloxicam (MOBIC) 15 MG tablet Take 15 mg by mouth daily as needed for pain.      [provider]  metoprolol succinate (TOPROL XL) 25 MG 24 hr tablet Take 1 tablet (25 mg total) by mouth daily. 08/10/21   Parke Poisson, MD  metroNIDAZOLE (FLAGYL) 500 MG tablet Take 1 tablet (500 mg total) by mouth 2 (two) times daily. 12/10/22   White, Elizabeth A, PA-C  nitroGLYCERIN (NITROSTAT) 0.4 MG SL tablet Place 1 tablet (0.4 mg total) under the tongue every 5 (five) minutes as needed for chest pain. 06/23/21 03/06/22  Sharlene Dory, PA-C  OZEMPIC, 0.25 OR 0.5 MG/DOSE, 2 MG/3ML SOPN Inject 0.5 mg into the skin once a week.    [provider]  pantoprazole (PROTONIX) 40 MG tablet Take 1 tablet by mouth as needed. 10/05/20   [provider]  rosuvastatin (CRESTOR) 5 MG tablet Take 5 mg by mouth daily. 11/26/18   [provider]      Allergies    Aspirin, Lisinopril, Penicillins, Crestor [rosuvastatin calcium], and Influenza vaccine live    Review of Systems   Review of Systems  Constitutional:  Positive for diaphoresis. Negative for fever.  Respiratory:  Negative for shortness of breath.   Cardiovascular:  Negative for chest pain and leg swelling.  Gastrointestinal:  Negative for abdominal pain, diarrhea and vomiting.  Skin:  Negative for rash.  Neurological:  Negative for syncope, facial asymmetry, speech difficulty, weakness, numbness and headaches.    Physical Exam Updated Vital Signs BP  115/64   Pulse 67   Temp 97.9 F (36.6 C) (Oral)   Resp 16   Ht 5' 5.5" (1.664 m)   Wt 98.9 kg   SpO2 100%   BMI 35.73 kg/m  Physical Exam Vitals and nursing note reviewed.  Constitutional:      General: She is not in acute distress.    Appearance: Normal appearance. She is not ill-appearing.  HENT:     Head: Normocephalic.     Mouth/Throat:     Mouth: Mucous membranes are moist.  Eyes:     Pupils: Pupils are equal, round, and reactive to light.  Cardiovascular:     Rate and Rhythm: Normal rate.  Pulmonary:     Effort: Pulmonary effort is  normal. No respiratory distress.  Abdominal:     Palpations: Abdomen is soft.     Tenderness: There is no abdominal tenderness.  Musculoskeletal:        General: No swelling.  Skin:    General: Skin is warm.  Neurological:     Mental Status: She is alert and oriented to person, place, and time. Mental status is at baseline.  Psychiatric:        Mood and Affect: Mood normal.     ED Results / Procedures / Treatments   Labs (all labs ordered are listed, but only abnormal results are displayed) Labs Reviewed  BASIC METABOLIC PANEL WITH GFR - Abnormal; Notable for the following components:      Result Value   Glucose, Bld 143 (*)    Creatinine, Ser 1.48 (*)    GFR, Estimated 37 (*)    All other components within normal limits  CBC - Abnormal; Notable for the following components:   nRBC 0.4 (*)    All other components within normal limits  URINALYSIS, ROUTINE W REFLEX MICROSCOPIC - Abnormal; Notable for the following components:   Glucose, UA >=500 (*)    All other components within normal limits  CBG MONITORING, ED - Abnormal; Notable for the following components:   Glucose-Capillary 153 (*)    All other components within normal limits  CBG MONITORING, ED    EKG EKG Interpretation Date/Time:  Monday August 12 2023 12:03:00 EDT Ventricular Rate:  88 PR Interval:  200 QRS Duration:  114 QT Interval:  374 QTC Calculation: 452 R Axis:   -88  Text Interpretation: Normal sinus rhythm with sinus arrhythmia Left axis deviation When compared with ECG of 02-Dec-2013 07:50, PREVIOUS ECG IS PRESENT Confirmed by Virgina Norfolk [phone]) on 08/12/2023 1:47:05 PM  Radiology No results found.  Procedures Procedures    Medications Ordered in ED Medications - No data to display  ED Course/ Medical Decision Making/ A&P                                 Medical Decision Making Amount and/or Complexity of Data Reviewed Labs: ordered.   73 year old female presents emergency  department after an episode where she felt faint, sweaty.  This was after she took her morning medications did not eat breakfast.  This resolved after she drank some orange soda.  Vitals are normal and stable here.  The symptoms have completely resolved.  She otherwise had no complaint of any chest pain, shortness of breath, headache, neurodeficit.  She is well-appearing on exam.  EKG is sinus rhythm, no changes from previous.  Blood work is normal, urinalysis unremarkable.  Blood sugar stable here.  I suspect that her symptoms this morning were secondary to low blood sugar from not eating a meal after taking her morning medications.  She agrees.  We discussed small regular meals and she understands.  Otherwise I have low suspicion for ACS or neurologic pathology.  Patient at this time appears safe and stable for discharge and close outpatient follow up. Discharge plan and strict return to ED precautions discussed, patient verbalizes understanding and agreement.        Final Clinical Impression(s) / ED Diagnoses Final diagnoses:  Syncope, unspecified syncope type    Rx / DC Orders ED Discharge Orders     None         Flonnie Humphrey, DO 08/12/23 1646

## 2023-08-12 NOTE — ED Triage Notes (Signed)
 Patient is here visiting her son and when she got to the room family noticed she was breathing fast and diaphoretic and dizzy.  Patient is a diabetic and did not eat this morning.  Reports "I just started feeling back for a few minutes".  Denies pain anywhere at this time.

## 2023-08-12 NOTE — Discharge Instructions (Addendum)
 You have been seen and discharged from the emergency department.  Your workup here including EKG, blood work, blood sugar, urine were all normal.  I believe what happened earlier may have been from low blood sugar.  Make sure you are eating regular small meals, specifically in the morning when taking medications.  Follow-up with your primary provider for further evaluation and further care. Take home medications as prescribed. If you have any worsening symptoms or further concerns for your health please return to an emergency department for further evaluation.

## 2023-08-15 DIAGNOSIS — I25119 Atherosclerotic heart disease of native coronary artery with unspecified angina pectoris: Secondary | ICD-10-CM | POA: Diagnosis not present

## 2023-08-15 DIAGNOSIS — R059 Cough, unspecified: Secondary | ICD-10-CM | POA: Diagnosis not present

## 2023-08-15 DIAGNOSIS — N1832 Chronic kidney disease, stage 3b: Secondary | ICD-10-CM | POA: Diagnosis not present

## 2023-08-15 DIAGNOSIS — I10 Essential (primary) hypertension: Secondary | ICD-10-CM | POA: Diagnosis not present

## 2023-08-15 DIAGNOSIS — E1169 Type 2 diabetes mellitus with other specified complication: Secondary | ICD-10-CM | POA: Diagnosis not present

## 2023-08-15 DIAGNOSIS — D649 Anemia, unspecified: Secondary | ICD-10-CM | POA: Diagnosis not present

## 2023-08-15 DIAGNOSIS — L680 Hirsutism: Secondary | ICD-10-CM | POA: Diagnosis not present

## 2023-08-15 DIAGNOSIS — E782 Mixed hyperlipidemia: Secondary | ICD-10-CM | POA: Diagnosis not present

## 2023-08-15 DIAGNOSIS — R011 Cardiac murmur, unspecified: Secondary | ICD-10-CM | POA: Diagnosis not present

## 2023-08-22 DIAGNOSIS — N1832 Chronic kidney disease, stage 3b: Secondary | ICD-10-CM | POA: Diagnosis not present

## 2023-09-18 DIAGNOSIS — Z860100 Personal history of colon polyps, unspecified: Secondary | ICD-10-CM | POA: Diagnosis not present

## 2023-09-18 DIAGNOSIS — R1011 Right upper quadrant pain: Secondary | ICD-10-CM | POA: Diagnosis not present

## 2023-10-08 ENCOUNTER — Ambulatory Visit (HOSPITAL_COMMUNITY): Admission: EM | Admit: 2023-10-08 | Discharge: 2023-10-08 | Disposition: A | Discharge status: Home / Self Care

## 2023-10-08 ENCOUNTER — Encounter (HOSPITAL_COMMUNITY): Payer: Self-pay

## 2023-10-08 DIAGNOSIS — L03113 Cellulitis of right upper limb: Secondary | ICD-10-CM | POA: Diagnosis not present

## 2023-10-08 HISTORY — DX: Chronic kidney disease, unspecified: N18.9

## 2023-10-08 HISTORY — DX: Type 2 diabetes mellitus without complications: E11.9

## 2023-10-08 MED ORDER — DOXYCYCLINE HYCLATE 100 MG PO CAPS
100.0000 mg | ORAL_CAPSULE | Freq: Two times a day (BID) | ORAL | 0 refills | Status: AC
Start: 2023-10-08 — End: 2023-10-15

## 2023-10-08 NOTE — ED Provider Notes (Signed)
 [facility]    CSN: [id number] Arrival date & time: 10/08/23  1359      History   Chief Complaint Chief Complaint  Patient presents with   Joint Swelling    elbow    HPI Jamie Payne is a 73 y.o. female.  Yesterday developed redness and swelling of the right elbow after working in her yard. Not sure if something bit her or if she came into contact with something.  She was concerned the area of redness was spreading and felt warm.  Not having pain. No oral swelling or respiratory concerns   She put hydrocortisone cream on the area  Past Medical History:  Diagnosis Date   CKD (chronic kidney disease)    Glaucoma    Hypertension    Low hemoglobin    T2DM (type 2 diabetes mellitus) [id number] [facility])     Patient Active Problem List   Diagnosis Date Noted   Acute on chronic renal failure (HCC) 12/03/2013   GI bleed 12/02/2013   Acute blood loss anemia 12/02/2013   ARF (acute renal failure) (HCC) 12/02/2013   HTN (hypertension) 12/02/2013   Glaucoma 12/02/2013    Past Surgical History:  Procedure Laterality Date   BILATERAL SALPINGECTOMY     BREAST BIOPSY Left 01/02/2017   negative   COLONOSCOPY N/A 12/04/2013   Procedure: COLONOSCOPY;  Surgeon: Barbie Boon, MD;  Location: [facility] ENDOSCOPY;  Service: Endoscopy;  Laterality: N/A;   ESOPHAGOGASTRODUODENOSCOPY N/A 12/05/2013   Procedure: ESOPHAGOGASTRODUODENOSCOPY (EGD);  Surgeon: Mathew Solomon, MD;  Location: [facility] ENDOSCOPY;  Service: Endoscopy;  Laterality: N/A;   HERNIA REPAIR      OB History   No obstetric history on file.      Home Medications    Prior to Admission medications   Medication Sig Start Date End Date Taking? Authorizing Provider  doxycycline (VIBRAMYCIN) 100 MG capsule Take 1 capsule (100 mg total) by mouth 2 (two) times daily for 7 days. 10/08/23 10/15/23 Yes Shira Bobst, Ivette Marks, PA-C  amLODipine  (NORVASC ) 10 MG tablet Take 10 mg by mouth daily.    [provider]  cloNIDine (CATAPRES) 0.2 MG  tablet Take 1 tablet by mouth daily. 12/15/18   [provider]  dorzolamide-timolol  (COSOPT) 22.3-6.8 MG/ML ophthalmic solution Place 1 drop into the left eye 2 (two) times daily. 05/10/18   [provider]  Ferrous Sulfate (IRON ) 325 (65 Fe) MG TABS 1 tablet Orally Once a day    [provider]  hydrALAZINE  (APRESOLINE ) 25 MG tablet Take 1 tablet (25 mg total) by mouth 2 (two) times daily. PLEASE CALL OUR OFFICE FOR AN APPOINTMENT  WAS DUE TO BE SEEN IN AUGUST 01/24/21 03/06/22  Euell Herrlich, MD  JARDIANCE 10 MG TABS tablet Take 10 mg by mouth daily.    [provider]  meloxicam  (MOBIC ) 15 MG tablet Take 15 mg by mouth daily as needed for pain.     [provider]  metoprolol  succinate (TOPROL  XL) 25 MG 24 hr tablet Take 1 tablet (25 mg total) by mouth daily. Patient not taking: Reported on 10/08/2023 08/10/21   Acharya, Gayatri A, MD  metroNIDAZOLE  (FLAGYL ) 500 MG tablet Take 1 tablet (500 mg total) by mouth 2 (two) times daily. 12/10/22   White, Elizabeth A, PA-C  nitroGLYCERIN  (NITROSTAT ) 0.4 MG SL tablet Place 1 tablet (0.4 mg total) under the tongue every 5 (five) minutes as needed for chest pain. 06/23/21 03/06/22  Von Grumbling, PA-C  OZEMPIC, 0.25 OR  0.5 MG/DOSE, 2 MG/3ML SOPN Inject 0.5 mg into the skin once a week.    [provider]  pantoprazole  (PROTONIX ) 40 MG tablet Take 1 tablet by mouth as needed. 10/05/20   [provider]  rosuvastatin (CRESTOR) 5 MG tablet Take 5 mg by mouth daily. 11/26/18   [provider]    Family History Family History  Problem Relation Age of Onset   Hypertension Mother    Diabetes Mother    Breast cancer Maternal Grandmother    Breast cancer Paternal Grandmother    Hypertension Father    Renal Disease Father    Pancreatic cancer Brother    Bipolar disorder Daughter    Bipolar disorder Daughter    Pancreatic cancer Brother     Social History Social History   Tobacco Use    Smoking status: Never   Smokeless tobacco: Never  Vaping Use   Vaping status: Never Used  Substance Use Topics   Alcohol use: No   Drug use: No     Allergies   Aspirin, Lisinopril, Penicillins, Crestor [rosuvastatin calcium], and Influenza vaccine live   Review of Systems Review of Systems As per HPI  Physical Exam Triage Vital Signs ED Triage Vitals  Encounter Vitals Group     BP 10/08/23 1452 115/76     Systolic BP Percentile --      Diastolic BP Percentile --      Pulse Rate 10/08/23 1452 88     Resp 10/08/23 1452 16     Temp 10/08/23 1452 98.8 F (37.1 C)     Temp Source 10/08/23 1452 Oral     SpO2 10/08/23 1452 96 %     Weight --      Height --      Head Circumference --      Peak Flow --      Pain Score 10/08/23 1453 2     Pain Loc --      Pain Education --      Exclude from Growth Chart --    No data found.  Updated Vital Signs BP 115/76 (BP Location: Left Arm)   Pulse 88   Temp 98.8 F (37.1 C) (Oral)   Resp 16   SpO2 96%   Physical Exam Vitals and nursing note reviewed.  Constitutional:      General: She is not in acute distress.    Appearance: Normal appearance. She is not ill-appearing.  HENT:     Mouth/Throat:     Mouth: Mucous membranes are moist.     Pharynx: Oropharynx is clear.  Eyes:     Conjunctiva/sclera: Conjunctivae normal.  Cardiovascular:     Rate and Rhythm: Normal rate and regular rhythm.     Pulses: Normal pulses.     Heart sounds: Normal heart sounds.  Pulmonary:     Effort: Pulmonary effort is normal. No respiratory distress.     Breath sounds: Normal breath sounds. No wheezing.  Musculoskeletal:        General: Normal range of motion.     Cervical back: Normal range of motion.     Comments: Full ROM of right upper extremity. Strength and sensation normal  Skin:    Findings: Erythema present.          Comments: Erythema and mild swelling of skin left elbow to forearm. Hot to touch. No obvious wound or punctate  lesion. No tenderness to palpation   Neurological:     Mental Status: She is  alert and oriented to person, place, and time.    UC Treatments / Results  Labs (all labs ordered are listed, but only abnormal results are displayed) Labs Reviewed - No data to display  EKG  Radiology No results found.  Procedures Procedures   Medications Ordered in UC Medications - No data to display  Initial Impression / Assessment and Plan / UC Course  I have reviewed the triage vital signs and the nursing notes.  Pertinent labs & imaging results that were available during my care of the patient were reviewed by me and considered in my medical decision making (see chart for details).  Afebrile, well appearing. No oral swelling, clear lungs, no respiratory distress. Symptoms limited to localized area of skin Cellulitis, likely insect bite or scrape Doxycyline BID x 7 days. Other supportive care. Monitor symptoms, return precautions. Patient agrees to plan  Final Clinical Impressions(s) / UC Diagnoses   Final diagnoses:  Cellulitis of right elbow     Discharge Instructions      Please take medication as prescribed. Take with food to avoid upset stomach.  I also recommend applying ice to the arm to reduce swelling You can use hydrocortisone topically twice a day for any itching  Monitor for the next 4-5 days. If swelling/redness worsens after this time period despite antibiotic, please return    ED Prescriptions     Medication Sig Dispense Auth. Provider   doxycycline (VIBRAMYCIN) 100 MG capsule Take 1 capsule (100 mg total) by mouth 2 (two) times daily for 7 days. 14 capsule Zebulin Siegel, Ivette Marks, PA-C      PDMP not reviewed this encounter.   Creighton Doffing, [place] 10/08/23 1541

## 2023-10-08 NOTE — ED Triage Notes (Signed)
 Patient here today with c/o right elbow redness and swelling since yesterday after working in the yard. Patient states that the area of concern is spreading and has some heat to it.

## 2023-10-08 NOTE — Discharge Instructions (Signed)
 Please take medication as prescribed. Take with food to avoid upset stomach.  I also recommend applying ice to the arm to reduce swelling You can use hydrocortisone topically twice a day for any itching  Monitor for the next 4-5 days. If swelling/redness worsens after this time period despite antibiotic, please return

## 2023-11-28 ENCOUNTER — Other Ambulatory Visit: Payer: Medicare HMO

## 2023-12-02 DIAGNOSIS — E119 Type 2 diabetes mellitus without complications: Secondary | ICD-10-CM | POA: Diagnosis not present

## 2023-12-02 DIAGNOSIS — H401133 Primary open-angle glaucoma, bilateral, severe stage: Secondary | ICD-10-CM | POA: Diagnosis not present

## 2023-12-02 DIAGNOSIS — H35031 Hypertensive retinopathy, right eye: Secondary | ICD-10-CM | POA: Diagnosis not present

## 2023-12-12 ENCOUNTER — Ambulatory Visit (HOSPITAL_BASED_OUTPATIENT_CLINIC_OR_DEPARTMENT_OTHER)
Admission: RE | Admit: 2023-12-12 | Discharge: 2023-12-12 | Disposition: A | Discharge status: Home / Self Care | Source: Ambulatory Visit | Attending: Family Medicine | Admitting: Family Medicine

## 2023-12-12 DIAGNOSIS — E2839 Other primary ovarian failure: Secondary | ICD-10-CM | POA: Insufficient documentation

## 2023-12-12 DIAGNOSIS — Z780 Asymptomatic menopausal state: Secondary | ICD-10-CM | POA: Diagnosis not present

## 2024-01-20 DIAGNOSIS — E1122 Type 2 diabetes mellitus with diabetic chronic kidney disease: Secondary | ICD-10-CM | POA: Diagnosis not present

## 2024-01-23 DIAGNOSIS — E785 Hyperlipidemia, unspecified: Secondary | ICD-10-CM | POA: Diagnosis not present

## 2024-01-23 DIAGNOSIS — N1831 Chronic kidney disease, stage 3a: Secondary | ICD-10-CM | POA: Diagnosis not present

## 2024-01-23 DIAGNOSIS — E1122 Type 2 diabetes mellitus with diabetic chronic kidney disease: Secondary | ICD-10-CM | POA: Diagnosis not present

## 2024-01-23 DIAGNOSIS — I152 Hypertension secondary to endocrine disorders: Secondary | ICD-10-CM | POA: Diagnosis not present

## 2024-02-11 DIAGNOSIS — R1011 Right upper quadrant pain: Secondary | ICD-10-CM | POA: Diagnosis not present

## 2024-02-11 DIAGNOSIS — Z860100 Personal history of colon polyps, unspecified: Secondary | ICD-10-CM | POA: Diagnosis not present

## 2024-03-03 DIAGNOSIS — E782 Mixed hyperlipidemia: Secondary | ICD-10-CM | POA: Diagnosis not present

## 2024-03-03 DIAGNOSIS — D649 Anemia, unspecified: Secondary | ICD-10-CM | POA: Diagnosis not present

## 2024-03-03 DIAGNOSIS — Z1211 Encounter for screening for malignant neoplasm of colon: Secondary | ICD-10-CM | POA: Diagnosis not present

## 2024-03-03 DIAGNOSIS — I10 Essential (primary) hypertension: Secondary | ICD-10-CM | POA: Diagnosis not present

## 2024-03-03 DIAGNOSIS — R011 Cardiac murmur, unspecified: Secondary | ICD-10-CM | POA: Diagnosis not present

## 2024-03-03 DIAGNOSIS — I25119 Atherosclerotic heart disease of native coronary artery with unspecified angina pectoris: Secondary | ICD-10-CM | POA: Diagnosis not present

## 2024-03-03 DIAGNOSIS — Z0000 Encounter for general adult medical examination without abnormal findings: Secondary | ICD-10-CM | POA: Diagnosis not present

## 2024-03-03 DIAGNOSIS — E1169 Type 2 diabetes mellitus with other specified complication: Secondary | ICD-10-CM | POA: Diagnosis not present

## 2024-03-03 DIAGNOSIS — N1832 Chronic kidney disease, stage 3b: Secondary | ICD-10-CM | POA: Diagnosis not present

## 2024-03-09 ENCOUNTER — Other Ambulatory Visit (HOSPITAL_COMMUNITY): Payer: Self-pay | Admitting: Family Medicine

## 2024-03-09 DIAGNOSIS — R011 Cardiac murmur, unspecified: Secondary | ICD-10-CM

## 2024-03-20 ENCOUNTER — Encounter (HOSPITAL_BASED_OUTPATIENT_CLINIC_OR_DEPARTMENT_OTHER): Payer: Self-pay

## 2024-03-20 ENCOUNTER — Other Ambulatory Visit (HOSPITAL_BASED_OUTPATIENT_CLINIC_OR_DEPARTMENT_OTHER)

## 2024-04-29 ENCOUNTER — Ambulatory Visit (HOSPITAL_COMMUNITY)
Admission: RE | Admit: 2024-04-29 | Discharge: 2024-04-29 | Disposition: A | Discharge status: Home / Self Care | Source: Ambulatory Visit | Attending: Student in an Organized Health Care Education/Training Program | Admitting: Student in an Organized Health Care Education/Training Program

## 2024-04-29 DIAGNOSIS — R011 Cardiac murmur, unspecified: Secondary | ICD-10-CM | POA: Insufficient documentation

## 2024-04-29 LAB — ECHOCARDIOGRAM COMPLETE
Area-P 1/2: 3.32 cm2
S' Lateral: 2.3 cm
